# Patient Record
Sex: Female | Born: 1965 | Race: Black or African American | Hispanic: No | Marital: Married | Smoking: Never smoker
Health system: Southern US, Community
[De-identification: ages and names within clinical notes are randomized; demographics above are authoritative.]

## PROBLEM LIST (undated history)

## (undated) DIAGNOSIS — E785 Hyperlipidemia, unspecified: Secondary | ICD-10-CM

## (undated) DIAGNOSIS — F419 Anxiety disorder, unspecified: Secondary | ICD-10-CM

## (undated) DIAGNOSIS — F32A Depression, unspecified: Secondary | ICD-10-CM

## (undated) DIAGNOSIS — R011 Cardiac murmur, unspecified: Secondary | ICD-10-CM

## (undated) DIAGNOSIS — Z87898 Personal history of other specified conditions: Secondary | ICD-10-CM

## (undated) DIAGNOSIS — M1990 Unspecified osteoarthritis, unspecified site: Secondary | ICD-10-CM

## (undated) DIAGNOSIS — D573 Sickle-cell trait: Secondary | ICD-10-CM

## (undated) DIAGNOSIS — R0989 Other specified symptoms and signs involving the circulatory and respiratory systems: Secondary | ICD-10-CM

## (undated) DIAGNOSIS — K219 Gastro-esophageal reflux disease without esophagitis: Secondary | ICD-10-CM

## (undated) DIAGNOSIS — D649 Anemia, unspecified: Secondary | ICD-10-CM

## (undated) HISTORY — DX: Hyperlipidemia, unspecified: E78.5

## (undated) HISTORY — DX: Depression, unspecified: F32.A

## (undated) HISTORY — DX: Anxiety disorder, unspecified: F41.9

## (undated) HISTORY — PX: OTHER SURGICAL HISTORY: SHX169

## (undated) HISTORY — DX: Sickle-cell trait: D57.3

## (undated) HISTORY — DX: Other specified symptoms and signs involving the circulatory and respiratory systems: R09.89

## (undated) HISTORY — DX: Personal history of other specified conditions: Z87.898

## (undated) HISTORY — PX: DILATION AND CURETTAGE OF UTERUS: SHX78

## (undated) HISTORY — DX: Unspecified osteoarthritis, unspecified site: M19.90

---

## 1998-04-02 ENCOUNTER — Inpatient Hospital Stay (HOSPITAL_COMMUNITY): Admission: AD | Admit: 1998-04-02 | Discharge: 1998-04-05 | Payer: Self-pay | Admitting: Obstetrics and Gynecology

## 1998-04-07 ENCOUNTER — Encounter (HOSPITAL_COMMUNITY): Admission: RE | Admit: 1998-04-07 | Discharge: 1998-07-06 | Payer: Self-pay | Admitting: Obstetrics and Gynecology

## 1998-12-15 ENCOUNTER — Other Ambulatory Visit: Admission: RE | Admit: 1998-12-15 | Discharge: 1998-12-15 | Payer: Self-pay | Admitting: Obstetrics and Gynecology

## 1999-12-14 ENCOUNTER — Other Ambulatory Visit: Admission: RE | Admit: 1999-12-14 | Discharge: 1999-12-14 | Payer: Self-pay | Admitting: Obstetrics and Gynecology

## 2001-01-02 ENCOUNTER — Other Ambulatory Visit: Admission: RE | Admit: 2001-01-02 | Discharge: 2001-01-02 | Payer: Self-pay | Admitting: Obstetrics and Gynecology

## 2001-06-15 ENCOUNTER — Ambulatory Visit (HOSPITAL_COMMUNITY): Admission: RE | Admit: 2001-06-15 | Discharge: 2001-06-15 | Payer: Self-pay | Admitting: Obstetrics and Gynecology

## 2001-06-15 ENCOUNTER — Encounter: Payer: Self-pay | Admitting: Obstetrics and Gynecology

## 2001-12-18 ENCOUNTER — Other Ambulatory Visit: Admission: RE | Admit: 2001-12-18 | Discharge: 2001-12-18 | Payer: Self-pay | Admitting: Obstetrics and Gynecology

## 2002-12-24 ENCOUNTER — Other Ambulatory Visit: Admission: RE | Admit: 2002-12-24 | Discharge: 2002-12-24 | Payer: Self-pay | Admitting: Obstetrics and Gynecology

## 2003-01-16 ENCOUNTER — Encounter: Admission: RE | Admit: 2003-01-16 | Discharge: 2003-04-16 | Payer: Self-pay | Admitting: Obstetrics and Gynecology

## 2004-01-14 ENCOUNTER — Other Ambulatory Visit: Admission: RE | Admit: 2004-01-14 | Discharge: 2004-01-14 | Payer: Self-pay | Admitting: Obstetrics and Gynecology

## 2004-03-27 ENCOUNTER — Encounter: Admission: RE | Admit: 2004-03-27 | Discharge: 2004-03-27 | Payer: Self-pay | Admitting: Obstetrics and Gynecology

## 2004-09-22 ENCOUNTER — Emergency Department (HOSPITAL_COMMUNITY): Admission: EM | Admit: 2004-09-22 | Discharge: 2004-09-22 | Payer: Self-pay | Admitting: Family Medicine

## 2005-01-14 ENCOUNTER — Other Ambulatory Visit: Admission: RE | Admit: 2005-01-14 | Discharge: 2005-01-14 | Payer: Self-pay | Admitting: Obstetrics and Gynecology

## 2005-09-09 ENCOUNTER — Ambulatory Visit (HOSPITAL_COMMUNITY): Admission: RE | Admit: 2005-09-09 | Discharge: 2005-09-09 | Payer: Self-pay | Admitting: Obstetrics and Gynecology

## 2006-03-21 ENCOUNTER — Ambulatory Visit (HOSPITAL_COMMUNITY): Admission: RE | Admit: 2006-03-21 | Discharge: 2006-03-21 | Payer: Self-pay | Admitting: Obstetrics and Gynecology

## 2006-04-07 ENCOUNTER — Emergency Department (HOSPITAL_COMMUNITY): Admission: EM | Admit: 2006-04-07 | Discharge: 2006-04-07 | Payer: Self-pay | Admitting: Emergency Medicine

## 2007-04-06 HISTORY — PX: DILATION AND CURETTAGE OF UTERUS: SHX78

## 2007-06-12 ENCOUNTER — Emergency Department (HOSPITAL_COMMUNITY): Admission: EM | Admit: 2007-06-12 | Discharge: 2007-06-12 | Payer: Self-pay | Admitting: Emergency Medicine

## 2007-09-07 ENCOUNTER — Ambulatory Visit (HOSPITAL_COMMUNITY): Admission: RE | Admit: 2007-09-07 | Discharge: 2007-09-07 | Payer: Self-pay | Admitting: Obstetrics and Gynecology

## 2009-09-05 ENCOUNTER — Encounter: Admission: RE | Admit: 2009-09-05 | Discharge: 2009-09-05 | Payer: Self-pay | Admitting: Internal Medicine

## 2009-09-08 ENCOUNTER — Encounter: Admission: RE | Admit: 2009-09-08 | Discharge: 2009-09-08 | Payer: Self-pay | Admitting: Internal Medicine

## 2010-04-21 ENCOUNTER — Encounter
Admission: RE | Admit: 2010-04-21 | Discharge: 2010-04-21 | Payer: Self-pay | Source: Home / Self Care | Attending: Internal Medicine | Admitting: Internal Medicine

## 2010-04-26 ENCOUNTER — Encounter: Payer: Self-pay | Admitting: Obstetrics and Gynecology

## 2010-04-26 ENCOUNTER — Encounter: Payer: Self-pay | Admitting: Internal Medicine

## 2010-08-25 ENCOUNTER — Other Ambulatory Visit: Payer: Self-pay | Admitting: Internal Medicine

## 2010-08-25 DIAGNOSIS — N6310 Unspecified lump in the right breast, unspecified quadrant: Secondary | ICD-10-CM

## 2010-09-15 ENCOUNTER — Other Ambulatory Visit: Payer: Self-pay

## 2010-09-21 ENCOUNTER — Ambulatory Visit
Admission: RE | Admit: 2010-09-21 | Discharge: 2010-09-21 | Disposition: A | Discharge status: Home / Self Care | Payer: 59 | Source: Ambulatory Visit | Attending: Internal Medicine | Admitting: Internal Medicine

## 2010-09-21 DIAGNOSIS — N6310 Unspecified lump in the right breast, unspecified quadrant: Secondary | ICD-10-CM

## 2011-01-12 ENCOUNTER — Emergency Department (HOSPITAL_COMMUNITY): Payer: 59

## 2011-01-12 ENCOUNTER — Emergency Department (HOSPITAL_COMMUNITY)
Admission: EM | Admit: 2011-01-12 | Discharge: 2011-01-12 | Disposition: A | Discharge status: Home / Self Care | Payer: 59 | Attending: Emergency Medicine | Admitting: Emergency Medicine

## 2011-01-12 DIAGNOSIS — R079 Chest pain, unspecified: Secondary | ICD-10-CM | POA: Insufficient documentation

## 2011-01-12 DIAGNOSIS — R0602 Shortness of breath: Secondary | ICD-10-CM | POA: Insufficient documentation

## 2011-01-12 LAB — PROTIME-INR: INR: 0.96 (ref 0.00–1.49)

## 2011-01-12 LAB — DIFFERENTIAL
Eosinophils Relative: 2 % (ref 0–5)
Lymphocytes Relative: 35 % (ref 12–46)
Lymphs Abs: 2.4 10*3/uL (ref 0.7–4.0)
Monocytes Relative: 12 % (ref 3–12)
Neutrophils Relative %: 50 % (ref 43–77)

## 2011-01-12 LAB — BASIC METABOLIC PANEL
Calcium: 9.3 mg/dL (ref 8.4–10.5)
Chloride: 103 mEq/L (ref 96–112)
Creatinine, Ser: 0.56 mg/dL (ref 0.50–1.10)
GFR calc Af Amer: >90 mL/min (ref >90)
GFR calc non Af Amer: >90 mL/min (ref >90)
Glucose, Bld: 92 mg/dL (ref 70–99)

## 2011-01-12 LAB — POCT I-STAT TROPONIN I: Troponin i, poc: 0.01 ng/mL (ref 0.00–0.08)

## 2011-01-12 LAB — URINALYSIS, ROUTINE W REFLEX MICROSCOPIC
Nitrite: NEGATIVE
Specific Gravity, Urine: 1.006 (ref 1.005–1.030)

## 2011-01-12 LAB — CBC
HCT: 30.7 % — ABNORMAL LOW (ref 36.0–46.0)
Hemoglobin: 9.4 g/dL — ABNORMAL LOW (ref 12.0–15.0)
RBC: 5.14 MIL/uL — ABNORMAL HIGH (ref 3.87–5.11)
RDW: 20 % — ABNORMAL HIGH (ref 11.5–15.5)

## 2011-01-12 LAB — TROPONIN I: Troponin I: <0.3 ng/mL (ref <0.30)

## 2011-01-12 MED ORDER — IOHEXOL 300 MG/ML  SOLN
80.0000 mL | Freq: Once | INTRAMUSCULAR | Status: AC | PRN
  Administered 2011-01-12: 80 mL via INTRAVENOUS

## 2011-01-13 LAB — URINE CULTURE: Colony Count: 50000

## 2011-09-30 ENCOUNTER — Other Ambulatory Visit: Payer: Self-pay | Admitting: Internal Medicine

## 2011-09-30 DIAGNOSIS — Z1231 Encounter for screening mammogram for malignant neoplasm of breast: Secondary | ICD-10-CM

## 2011-10-04 ENCOUNTER — Ambulatory Visit
Admission: RE | Admit: 2011-10-04 | Discharge: 2011-10-04 | Disposition: A | Discharge status: Home / Self Care | Payer: 59 | Source: Ambulatory Visit | Attending: Internal Medicine | Admitting: Internal Medicine

## 2011-10-04 DIAGNOSIS — Z1231 Encounter for screening mammogram for malignant neoplasm of breast: Secondary | ICD-10-CM

## 2011-11-29 ENCOUNTER — Encounter (HOSPITAL_COMMUNITY): Payer: Self-pay | Admitting: Pharmacist

## 2011-12-01 ENCOUNTER — Other Ambulatory Visit: Payer: Self-pay | Admitting: Obstetrics & Gynecology

## 2011-12-03 ENCOUNTER — Encounter (HOSPITAL_COMMUNITY): Payer: Self-pay | Admitting: *Deleted

## 2011-12-09 MED ORDER — DEXTROSE 5 % IV SOLN
2.0000 g | Freq: Once | INTRAVENOUS | Status: AC
  Administered 2011-12-10: 2 g via INTRAVENOUS
  Filled 2011-12-09: qty 2

## 2011-12-10 ENCOUNTER — Encounter (HOSPITAL_COMMUNITY): Payer: Self-pay | Admitting: *Deleted

## 2011-12-10 ENCOUNTER — Ambulatory Visit (HOSPITAL_COMMUNITY)
Admission: RE | Admit: 2011-12-10 | Discharge: 2011-12-10 | Disposition: A | Discharge status: Home / Self Care | Payer: 59 | Source: Ambulatory Visit | Attending: Obstetrics & Gynecology | Admitting: Obstetrics & Gynecology

## 2011-12-10 ENCOUNTER — Encounter (HOSPITAL_COMMUNITY)
Admission: RE | Disposition: A | Discharge status: Home / Self Care | Payer: Self-pay | Source: Ambulatory Visit | Attending: Obstetrics & Gynecology

## 2011-12-10 ENCOUNTER — Encounter (HOSPITAL_COMMUNITY): Payer: Self-pay | Admitting: Anesthesiology

## 2011-12-10 ENCOUNTER — Ambulatory Visit (HOSPITAL_COMMUNITY): Payer: 59 | Admitting: Anesthesiology

## 2011-12-10 DIAGNOSIS — N920 Excessive and frequent menstruation with regular cycle: Secondary | ICD-10-CM | POA: Insufficient documentation

## 2011-12-10 DIAGNOSIS — D251 Intramural leiomyoma of uterus: Secondary | ICD-10-CM | POA: Insufficient documentation

## 2011-12-10 DIAGNOSIS — D250 Submucous leiomyoma of uterus: Secondary | ICD-10-CM | POA: Insufficient documentation

## 2011-12-10 DIAGNOSIS — D649 Anemia, unspecified: Secondary | ICD-10-CM | POA: Insufficient documentation

## 2011-12-10 HISTORY — DX: Gastro-esophageal reflux disease without esophagitis: K21.9

## 2011-12-10 HISTORY — DX: Anemia, unspecified: D64.9

## 2011-12-10 HISTORY — DX: Cardiac murmur, unspecified: R01.1

## 2011-12-10 LAB — CBC
Hemoglobin: 9.1 g/dL — ABNORMAL LOW (ref 12.0–15.0)
MCH: 19.7 pg — ABNORMAL LOW (ref 26.0–34.0)
MCV: 61.8 fL — ABNORMAL LOW (ref 78.0–100.0)
Platelets: 360 10*3/uL (ref 150–400)
RBC: 4.63 MIL/uL (ref 3.87–5.11)
WBC: 4.6 10*3/uL (ref 4.0–10.5)

## 2011-12-10 LAB — PREGNANCY, URINE: Preg Test, Ur: NEGATIVE

## 2011-12-10 SURGERY — DILATATION & CURETTAGE/HYSTEROSCOPY WITH RESECTOCOPE
Anesthesia: General | Site: Uterus | Wound class: Clean Contaminated

## 2011-12-10 MED ORDER — SODIUM CHLORIDE BACTERIOSTATIC 0.9 % IJ SOLN
INTRAMUSCULAR | Status: DC | PRN
  Administered 2011-12-10: 3000 mL

## 2011-12-10 MED ORDER — FENTANYL CITRATE 0.05 MG/ML IJ SOLN
INTRAMUSCULAR | Status: AC
  Filled 2011-12-10: qty 5

## 2011-12-10 MED ORDER — KETOROLAC TROMETHAMINE 30 MG/ML IJ SOLN
INTRAMUSCULAR | Status: DC | PRN
  Administered 2011-12-10: 30 mg via INTRAVENOUS

## 2011-12-10 MED ORDER — CHLOROPROCAINE HCL 1 % IJ SOLN
INTRAMUSCULAR | Status: AC
  Filled 2011-12-10: qty 30

## 2011-12-10 MED ORDER — FENTANYL CITRATE 0.05 MG/ML IJ SOLN: 25.0000 ug | INTRAMUSCULAR | Status: DC | PRN

## 2011-12-10 MED ORDER — OXYCODONE-ACETAMINOPHEN 5-325 MG PO TABS
ORAL_TABLET | ORAL | Status: AC
  Administered 2011-12-10: 1 via ORAL
  Filled 2011-12-10: qty 1

## 2011-12-10 MED ORDER — ONDANSETRON HCL 4 MG/2ML IJ SOLN
INTRAMUSCULAR | Status: DC | PRN
  Administered 2011-12-10: 4 mg via INTRAVENOUS

## 2011-12-10 MED ORDER — OXYCODONE-ACETAMINOPHEN 5-325 MG PO TABS
1.0000 | ORAL_TABLET | ORAL | Status: DC | PRN
  Administered 2011-12-10: 1 via ORAL

## 2011-12-10 MED ORDER — CHLOROPROCAINE HCL 1 % IJ SOLN
INTRAMUSCULAR | Status: DC | PRN
  Administered 2011-12-10: 20 mL

## 2011-12-10 MED ORDER — PHENYLEPHRINE 40 MCG/ML (10ML) SYRINGE FOR IV PUSH (FOR BLOOD PRESSURE SUPPORT)
PREFILLED_SYRINGE | INTRAVENOUS | Status: AC
  Filled 2011-12-10: qty 5

## 2011-12-10 MED ORDER — ONDANSETRON HCL 4 MG/2ML IJ SOLN
INTRAMUSCULAR | Status: AC
  Filled 2011-12-10: qty 2

## 2011-12-10 MED ORDER — OXYCODONE-ACETAMINOPHEN 7.5-325 MG PO TABS: 1.0000 | ORAL_TABLET | ORAL | Status: AC | PRN

## 2011-12-10 MED ORDER — LIDOCAINE HCL (CARDIAC) 20 MG/ML IV SOLN
INTRAVENOUS | Status: AC
  Filled 2011-12-10: qty 5

## 2011-12-10 MED ORDER — KETOROLAC TROMETHAMINE 60 MG/2ML IM SOLN
INTRAMUSCULAR | Status: AC
  Filled 2011-12-10: qty 2

## 2011-12-10 MED ORDER — KETOROLAC TROMETHAMINE 30 MG/ML IJ SOLN: 15.0000 mg | Freq: Once | INTRAMUSCULAR | Status: DC | PRN

## 2011-12-10 MED ORDER — PROPOFOL 10 MG/ML IV EMUL
INTRAVENOUS | Status: DC | PRN
  Administered 2011-12-10: 200 mg via INTRAVENOUS

## 2011-12-10 MED ORDER — MEPERIDINE HCL 25 MG/ML IJ SOLN: 6.2500 mg | INTRAMUSCULAR | Status: DC | PRN

## 2011-12-10 MED ORDER — PROPOFOL 10 MG/ML IV EMUL
INTRAVENOUS | Status: AC
  Filled 2011-12-10: qty 20

## 2011-12-10 MED ORDER — MIDAZOLAM HCL 5 MG/5ML IJ SOLN
INTRAMUSCULAR | Status: DC | PRN
  Administered 2011-12-10: 2 mg via INTRAVENOUS

## 2011-12-10 MED ORDER — LACTATED RINGERS IV SOLN
INTRAVENOUS | Status: DC
  Administered 2011-12-10 (×3): via INTRAVENOUS

## 2011-12-10 MED ORDER — LIDOCAINE HCL (CARDIAC) 20 MG/ML IV SOLN
INTRAVENOUS | Status: DC | PRN
  Administered 2011-12-10: 60 mg via INTRAVENOUS

## 2011-12-10 MED ORDER — ONDANSETRON HCL 4 MG/2ML IJ SOLN: 4.0000 mg | Freq: Once | INTRAMUSCULAR | Status: DC | PRN

## 2011-12-10 MED ORDER — MIDAZOLAM HCL 2 MG/2ML IJ SOLN
INTRAMUSCULAR | Status: AC
  Filled 2011-12-10: qty 2

## 2011-12-10 MED ORDER — CARBOPROST TROMETHAMINE 250 MCG/ML IM SOLN
INTRAMUSCULAR | Status: AC
  Filled 2011-12-10: qty 1

## 2011-12-10 MED ORDER — METHYLERGONOVINE MALEATE 0.2 MG/ML IJ SOLN
INTRAMUSCULAR | Status: AC
  Filled 2011-12-10: qty 1

## 2011-12-10 MED ORDER — FENTANYL CITRATE 0.05 MG/ML IJ SOLN
INTRAMUSCULAR | Status: DC | PRN
  Administered 2011-12-10: 50 ug via INTRAVENOUS
  Administered 2011-12-10: 100 ug via INTRAVENOUS
  Administered 2011-12-10: 50 ug via INTRAVENOUS

## 2011-12-10 MED ORDER — DEXAMETHASONE SODIUM PHOSPHATE 10 MG/ML IJ SOLN
INTRAMUSCULAR | Status: AC
  Filled 2011-12-10: qty 1

## 2011-12-10 MED ORDER — DEXAMETHASONE SODIUM PHOSPHATE 4 MG/ML IJ SOLN
INTRAMUSCULAR | Status: DC | PRN
  Administered 2011-12-10: 10 mg via INTRAVENOUS

## 2011-12-10 SURGICAL SUPPLY — 16 items
ABLATOR ENDOMETRIAL BIPOLAR (ABLATOR) IMPLANT
CANISTER SUCTION 2500CC (MISCELLANEOUS) ×2 IMPLANT
CATH ROBINSON RED A/P 16FR (CATHETERS) ×2 IMPLANT
CLOTH BEACON ORANGE TIMEOUT ST (SAFETY) ×2 IMPLANT
CONTAINER PREFILL 10% NBF 60ML (FORM) ×4 IMPLANT
ELECT REM PT RETURN 9FT ADLT (ELECTROSURGICAL)
ELECTRODE REM PT RTRN 9FT ADLT (ELECTROSURGICAL) ×1 IMPLANT
GLOVE BIO SURGEON STRL SZ 6.5 (GLOVE) ×4 IMPLANT
GLOVE BIOGEL PI IND STRL 7.0 (GLOVE) ×1 IMPLANT
GLOVE BIOGEL PI INDICATOR 7.0 (GLOVE) ×1
GOWN PREVENTION PLUS LG XLONG (DISPOSABLE) ×4 IMPLANT
GOWN STRL REIN XL XLG (GOWN DISPOSABLE) ×2 IMPLANT
LOOP ANGLED CUTTING 22FR (CUTTING LOOP) IMPLANT
PACK HYSTEROSCOPY LF (CUSTOM PROCEDURE TRAY) ×2 IMPLANT
TOWEL OR 17X24 6PK STRL BLUE (TOWEL DISPOSABLE) ×4 IMPLANT
WATER STERILE IRR 1000ML POUR (IV SOLUTION) ×2 IMPLANT

## 2011-12-10 NOTE — Discharge Summary (Signed)
  Physician Discharge Summary  Patient ID: Wendy Pineda MRN: [id number] DOB/AGE: 1965/09/22 46 y.o.  Admit date: 12/10/2011 Discharge date: 12/10/2011  Admission Diagnoses: Uterine Fibroids  Discharge Diagnoses: Uterine Fibroids        Active Problems:  * No active hospital problems. *    Discharged Condition: good  Hospital Course: Outpatient Consults: None  Treatments: surgery: Hysteroscopy, ressection of submucosal myoma, D+C  Disposition: 01-Home or Self Care   Medication List  As of 12/10/2011  3:58 PM   TAKE these medications         ANTIOXIDANT PO   Take 1 packet by mouth daily.      B COMPLEX PO   Take 1 packet by mouth daily.      folic acid 400 MCG tablet   Commonly known as: FOLVITE   Take 400 mcg by mouth daily.      IRON PO   Take 1 tablet by mouth daily.      oxyCODONE-acetaminophen 7.5-325 MG per tablet   Commonly known as: PERCOCET   Take 1 tablet by mouth every 4 (four) hours as needed for pain.           Follow-up Information    Follow up with Soleia Badolato,MARIE-LYNE, MD in 3 weeks.   Contact information:   [address] [place] [address] [phone]          SignedGenia Del, MD 12/10/2011, 3:58 PM

## 2011-12-10 NOTE — Anesthesia Preprocedure Evaluation (Signed)
Anesthesia Evaluation  Patient identified by MRN, date of birth, ID band Patient awake    Reviewed: Allergy & Precautions, H&P , NPO status , Patient's Chart, lab work & pertinent test results  Airway Mallampati: I TM Distance: >3 FB Neck ROM: full    Dental No notable dental hx. (+) Teeth Intact   Pulmonary neg pulmonary ROS,    Pulmonary exam normal       Cardiovascular     Neuro/Psych negative neurological ROS  negative psych ROS   GI/Hepatic Neg liver ROS,   Endo/Other  negative endocrine ROS  Renal/GU negative Renal ROS  negative genitourinary   Musculoskeletal negative musculoskeletal ROS (+)   Abdominal Normal abdominal exam  (+)   Peds negative pediatric ROS (+)  Hematology negative hematology ROS (+)   Anesthesia Other Findings   Reproductive/Obstetrics negative OB ROS                           Anesthesia Physical Anesthesia Plan  ASA: II  Anesthesia Plan: General   Post-op Pain Management:    Induction: Intravenous  Airway Management Planned: LMA  Additional Equipment:   Intra-op Plan:   Post-operative Plan:   Informed Consent: I have reviewed the patients History and Physical, chart, labs and discussed the procedure including the risks, benefits and alternatives for the proposed anesthesia with the patient or authorized representative who has indicated his/her understanding and acceptance.     Plan Discussed with: CRNA and Surgeon  Anesthesia Plan Comments:         Anesthesia Quick Evaluation

## 2011-12-10 NOTE — Anesthesia Postprocedure Evaluation (Signed)
Anesthesia Post Note  Patient: Wendy Pineda  Procedure(s) Performed: Procedure(s) (LRB): DILATATION & CURETTAGE/HYSTEROSCOPY WITH RESECTOCOPE (N/A)  Anesthesia type: GA  Patient location: PACU  Post pain: Pain level controlled  Post assessment: Post-op Vital signs reviewed  Last Vitals:  Filed Vitals:   12/10/11 1615  BP:   Pulse: 57  Temp:   Resp: 16    Post vital signs: Reviewed  Level of consciousness: sedated  Complications: No apparent anesthesia complications

## 2011-12-10 NOTE — H&P (Signed)
Wendy Pineda is an 46 y.o. female  G1P1  RP:  Menorrhagia with multiple uterine myomas, 2 SM myomas for HSC, D+C, Ressection of IU lesions with TruClear device.  Pertinent Gynecological History: Menses: flow is excessive with use of many pads or tampons on heaviest days Contraception: Infertility Blood transfusions: none Sexually transmitted diseases: no past history Previous GYN Procedures: DNC  Last mammogram: Normal  Last pap: normal  OB History:  G1P1   Menstrual History: Patient's last menstrual period was 11/26/2011.    Past Medical History  Diagnosis Date  . Heart murmur   . Anemia   . GERD (gastroesophageal reflux disease)     occasional-tums prn-overindulgence    Past Surgical History  Procedure Date  . Dilation and curettage of uterus 2009    [facility]  . Colonoscopy     History reviewed. No pertinent family history.  Social History:  reports that she has never smoked. She does not have any smokeless tobacco history on file. She reports that she does not drink alcohol or use illicit drugs.  Allergies:  Allergies  Allergen Reactions  . Sulfa Antibiotics Hives and Swelling    Prescriptions prior to admission  Medication Sig Dispense Refill  . B Complex Vitamins (B COMPLEX PO) Take 1 packet by mouth daily.      . folic acid (FOLVITE) 400 MCG tablet Take 400 mcg by mouth daily.      . IRON PO Take 1 tablet by mouth daily.      . Multiple Vitamins-Minerals (ANTIOXIDANT PO) Take 1 packet by mouth daily.        Blood pressure 153/81, pulse 71, temperature 98.4 F (36.9 C), temperature source Oral, resp. rate 18, height 5\' 7"  (1.702 m), weight 76.658 kg (169 lb), last menstrual period 11/26/2011, SpO2 99.00%.  Pelvic US:  Multiple uterine myomas with probably 2 SM myomas 1.8 and 2 cm in diameter.  Results for orders placed during the hospital encounter of 12/10/11 (from the past 24 hour(s))  CBC     Status: Abnormal   Collection Time   12/10/11 12:31 PM   Component Value Range   WBC 4.6  4.0 - 10.5 K/uL   RBC 4.63  3.87 - 5.11 MIL/uL   Hemoglobin 9.1 (*) 12.0 - 15.0 g/dL   HCT 11.9 (*) 14.7 - 82.9 %   MCV 61.8 (*) 78.0 - 100.0 fL   MCH 19.7 (*) 26.0 - 34.0 pg   MCHC 31.8  30.0 - 36.0 g/dL   RDW 56.2 (*) 13.0 - 86.5 %   Platelets 360  150 - 400 K/uL  PREGNANCY, URINE     Status: Normal   Collection Time   12/10/11 12:32 PM      Component Value Range   Preg Test, Ur NEGATIVE  NEGATIVE    No results found.  Assessment/Plan: Menorrhagia with multiple uterine myomas with 2 probable SM myomas 2 and 1.8 cm for HSC, D+C, Ressection of myomas with TruClear.  Surgery and risks reviewed.  Savina Olshefski,MARIE-LYNE 12/10/2011, 12:59 PM

## 2011-12-10 NOTE — Anesthesia Procedure Notes (Signed)
Procedure Name: LMA Insertion Date/Time: 12/10/2011 2:13 PM Performed by: Graciela Husbands Pre-anesthesia Checklist: Suction available, Emergency Drugs available, Timeout performed, Patient identified and Patient being monitored Patient Re-evaluated:Patient Re-evaluated prior to inductionOxygen Delivery Method: Circle system utilized Preoxygenation: Pre-oxygenation with 100% oxygen Intubation Type: IV induction Ventilation: Mask ventilation without difficulty LMA: LMA inserted LMA Size: 4.0 Number of attempts: 1 Placement Confirmation: positive ETCO2 and breath sounds checked- equal and bilateral Tube secured with: Tape Dental Injury: Teeth and Oropharynx as per pre-operative assessment

## 2011-12-10 NOTE — Op Note (Signed)
12/10/2011  3:43 PM  PATIENT:  Wendy Pineda  46 y.o. female  PRE-OPERATIVE DIAGNOSIS:  Menorrhagia, anemia, submucosal myomas  POST-OPERATIVE DIAGNOSIS:  same  PROCEDURE:  Procedure(s): DILATATION & CURETTAGE/HYSTEROSCOPY WITH RESECTOCOPE  SURGEON:  Surgeon(s): Genia Del, MD  ASSISTANTS: none   ANESTHESIA:   general  PROCEDURE:  Under general anesthesia with laryngeal mask the patient is in lithotomy position. She is prepped with Betadine on the suprapubic vulvar and vaginal areas. And draped as usual. The vaginal exam reveals an enlarged nodular uterus about 10 cm, mobile, no adnexal mass.  The speculum was introduced in the vagina. The anterior lip of the cervix is grasped with a tenaculum. A paracervical block is done with Nesacaine 1% a total of 20 cc at 4 and 8:00.  Dilation of the cervix with Hegar dilators up to #33 without difficulty.  The operative hysteroscope with the Myoma Truclear device are introduced in the intrauterine cavity.  A large, about 2.5-3 cm in diameter, submucosal myoma is seen attached to the left lateral wall of the intrauterine cavity.  2 intramural myoma is with a small sub-mucosal component are present on the right anterior wall and a right posterior wall of the uterus. Both ostia are well visualized. Pictures are taken. We then used the true clear device to resect the left submucosal myoma completely. We then resect the submucosal part of the intramural myomas on the right anterior and posterior walls.  A systematic endometrial curettage is done with a sharp curet over all endometrial surfaces. The 2 specimens are sent separately to pathology.  The intrauterine cavity is visualized once more with the hysteroscope. Minor resection and aspiration of any free pieces of myoma is removed from the cavity. Hemostasis is adequate at all levels. All instruments are removed. Hemostasis was adequate on the cervix.  The patient was brought to recovery room in good and  stable status.    ESTIMATED BLOOD LOSS:  25 cc   Intake/Output Summary (Last 24 hours) at 12/10/11 1543 Last data filed at 12/10/11 1536  Gross per 24 hour  Intake   1800 ml  Output     25 ml  Net   1775 ml     BLOOD ADMINISTERED:none   LOCAL MEDICATIONS USED:  OTHER Nesacaine  SPECIMEN:  Source of Specimen:  Submucosal myoma and Endometrial curettings  DISPOSITION OF SPECIMEN:  PATHOLOGY  COUNTS:  YES  PLAN OF CARE: Transfer to PACU    Genia Del MD  12/10/2011  At 3:45 pm

## 2011-12-10 NOTE — Transfer of Care (Signed)
Immediate Anesthesia Transfer of Care Note  Patient: Wendy Pineda  Procedure(s) Performed: Procedure(s) (LRB) with comments: DILATATION & CURETTAGE/HYSTEROSCOPY WITH RESECTOCOPE (N/A) - resection of submucosal myoma  Patient Location: PACU  Anesthesia Type: General  Level of Consciousness: awake, alert  and oriented  Airway & Oxygen Therapy: Patient Spontanous Breathing and Patient connected to nasal cannula oxygen  Post-op Assessment: Report given to PACU RN and Post -op Vital signs reviewed and stable  Post vital signs: Reviewed and stable  Complications: No apparent anesthesia complications

## 2012-06-02 ENCOUNTER — Encounter: Payer: Self-pay | Admitting: Cardiology

## 2013-03-30 ENCOUNTER — Ambulatory Visit (INDEPENDENT_AMBULATORY_CARE_PROVIDER_SITE_OTHER): Payer: 59 | Admitting: Internal Medicine

## 2013-03-30 VITALS — BP 118/76 | HR 90 | Temp 98.0°F | Resp 16 | Ht 67.0 in | Wt 155.2 lb

## 2013-03-30 DIAGNOSIS — R350 Frequency of micturition: Secondary | ICD-10-CM

## 2013-03-30 DIAGNOSIS — R05 Cough: Secondary | ICD-10-CM

## 2013-03-30 LAB — POCT UA - MICROSCOPIC ONLY
Bacteria, U Microscopic: NEGATIVE
Casts, Ur, LPF, POC: NEGATIVE
Crystals, Ur, HPF, POC: NEGATIVE

## 2013-03-30 LAB — POCT URINALYSIS DIPSTICK
Ketones, UA: NEGATIVE
Protein, UA: NEGATIVE
Spec Grav, UA: 1.01
Urobilinogen, UA: 0.2
pH, UA: 7

## 2013-03-30 MED ORDER — AZITHROMYCIN 250 MG PO TABS: ORAL_TABLET | ORAL | Status: DC

## 2013-03-30 MED ORDER — HYDROCODONE-HOMATROPINE 5-1.5 MG/5ML PO SYRP: 5.0000 mL | ORAL_SOLUTION | Freq: Four times a day (QID) | ORAL | Status: DC | PRN

## 2013-03-30 NOTE — Progress Notes (Signed)
   Subjective:    Patient ID: Wendy Pineda, female    DOB: [birth year], 47 y.o.   MRN: [id number]  HPI one week ago started with sore throat and fever Has progressed to involve both ears with pain, cough at night, productive cough over the last 24 hours and fatigue Has also noticed urinary frequency over the last 4 days with pressure sensation but no dysuria Married without outside partners  There are no active problems to display for this patient.  no outside medications   Review of Systems Noncontributory     Objective:   Physical Exam BP 118/76  Pulse 90  Temp(Src) 98 F (36.7 C) (Oral)  Resp 16  Ht 5\' 7"  (1.702 m)  Wt 155 lb 3.2 oz (70.398 kg)  BMI 24.30 kg/m2  SpO2 100%  LMP 02/27/2013 TMs with fluid but no redness Conjunctiva clear Nares boggy Throat clear No nodes Chest with slight wheezing on expiration and fine rales at the right base Heart regular Abdomen benign  Results for orders placed in visit on 03/30/13  POCT UA - MICROSCOPIC ONLY      Result Value Range   WBC, Ur, HPF, POC neg     RBC, urine, microscopic neg     Bacteria, U Microscopic neg     Mucus, UA neg     Epithelial cells, urine per micros 0-2     Crystals, Ur, HPF, POC neg     Casts, Ur, LPF, POC neg     Yeast, UA neg    POCT URINALYSIS DIPSTICK      Result Value Range   Color, UA yellow     Clarity, UA clear     Glucose, UA neg     Bilirubin, UA neg     Ketones, UA neg     Spec Grav, UA 1.010     Blood, UA neg     pH, UA 7.0     Protein, UA neg     Urobilinogen, UA 0.2     Nitrite, UA neg     Leukocytes, UA Negative           Assessment & Plan:  Urinary frequency - Plan: POCT UA - Microscopic Only, POCT urinalysis dipstick  Cough due lower resp infection? RLL community-acquired pneumonia  Meds ordered this encounter  Medications  . HYDROcodone-homatropine (HYCODAN) 5-1.5 MG/5ML syrup    Sig: Take 5 mLs by mouth every 6 (six) hours as needed for cough.    Dispense:  120  mL    Refill:  0  . azithromycin (ZITHROMAX) 250 MG tablet    Sig: As packaged    Dispense:  6 tablet    Refill:  0   Followup in 3 days force

## 2013-06-21 ENCOUNTER — Ambulatory Visit (INDEPENDENT_AMBULATORY_CARE_PROVIDER_SITE_OTHER): Payer: 59 | Admitting: Emergency Medicine

## 2013-06-21 VITALS — BP 120/80 | HR 82 | Temp 98.1°F | Resp 16 | Ht 65.5 in | Wt 151.4 lb

## 2013-06-21 DIAGNOSIS — S060X0A Concussion without loss of consciousness, initial encounter: Secondary | ICD-10-CM

## 2013-06-21 NOTE — Progress Notes (Signed)
[facility] [address], [place] [address] [phone]  Date:  06/21/2013   Name:  Wendy Pineda   DOB:  1966/02/03   MRN:  [id number]  PCP:  No primary provider on file.    Chief Complaint: Headache and Memory Loss   History of Present Illness:  Wendy Pineda is a 48 y.o. very pleasant female patient who presents with the following:  Patient suffered a head injury when she walked into a hoist block two weeks ago.  She was neither knocked down to the ground nor knocked out.  She has been seen four times by her workers comp doc since the injury, as recently as yesterday, and undergone a reportedly negative CT and has been referred to a neurologist. She was given a prescription for norco last night that she has not filled and is complaining bitterly that no one is helping her and nothing has been done for her. She was sent home from work today as they cannot offer her a position that meets her work limitations.  She has requested the first available appt with the neurology consultant to see if they cannot move her up from the 30th of this month.   She has no new symptoms related to this apparent concussion with no LOC. She has no focal neuro or visual symptoms.   No improvement with over the counter medications or other home remedies. Denies other complaint or health concern today.   There are no active problems to display for this patient.   Past Medical History  Diagnosis Date  . Heart murmur     echo 10/11/2007 EF >55%mild, MR  . Anemia   . GERD (gastroesophageal reflux disease)     occasional-tums prn-overindulgence  . Hx of chest pain     neg. lexiscan myoview 02/23/2011  . Hyperlipidemia     treated by diet  . Carotid bruit     carotid dopplers 12/22/2010, no hemodynamically significant stenosis    Past Surgical History  Procedure Laterality Date  . Dilation and curettage of uterus  2009    [facility]  . Colonoscopy      History  Substance Use Topics  .  Smoking status: Never Smoker   . Smokeless tobacco: Never Used  . Alcohol Use: No    Family History  Problem Relation Age of Onset  . Stroke Maternal Grandmother   . Cancer Maternal Grandfather     Allergies  Allergen Reactions  . Sulfa Antibiotics Hives and Swelling    Medication list has been reviewed and updated.  Current Outpatient Prescriptions on File Prior to Visit  Medication Sig Dispense Refill  . folic acid (FOLVITE) 865 MCG tablet Take 400 mcg by mouth daily.      . IRON PO Take 1 tablet by mouth daily.      . Multiple Vitamins-Minerals (ANTIOXIDANT PO) Take 1 packet by mouth daily.      Marland Kitchen azithromycin (ZITHROMAX) 250 MG tablet As packaged  6 tablet  0  . B Complex Vitamins (B COMPLEX PO) Take 1 packet by mouth daily.      Marland Kitchen HYDROcodone-homatropine (HYCODAN) 5-1.5 MG/5ML syrup Take 5 mLs by mouth every 6 (six) hours as needed for cough.  120 mL  0   No current facility-administered medications on file prior to visit.    Review of Systems:  As per HPI, otherwise negative.    Physical Examination: Filed Vitals:   06/21/13 1808  BP: 120/80  Pulse: 82  Temp: 98.1 F (36.7 C)  Resp: 16   Filed Vitals:   06/21/13 1808  Height: 5' 5.5" (1.664 m)  Weight: 151 lb 6.4 oz (68.675 kg)   Body mass index is 24.8 kg/(m^2). Ideal Body Weight: Weight in (lb) to have BMI = 25: 152.2    GEN: WDWN, NAD, Non-toxic, Alert & Oriented x 3. Mae.   HEENT: Atraumatic, Normocephalic.  Ears and Nose: No external deformity. EXTR: No clubbing/cyanosis/edema NEURO: Normal gait, balance and coordination. PSYCH: Normally interactive. Conversant. Not depressed or anxious appearing.  Calm demeanor.    Assessment and Plan: Concussion with no LOC Follow up with neuro I explained repeatedly the risk of her having to pay out of pocket for any care she seeks outside of her [facility] physician  Signed,  Ellison Carwin, MD

## 2013-06-21 NOTE — Patient Instructions (Signed)

## 2013-07-30 ENCOUNTER — Other Ambulatory Visit: Payer: Self-pay

## 2013-11-05 ENCOUNTER — Other Ambulatory Visit: Payer: Self-pay

## 2013-11-05 DIAGNOSIS — Z1231 Encounter for screening mammogram for malignant neoplasm of breast: Secondary | ICD-10-CM

## 2013-11-15 ENCOUNTER — Ambulatory Visit: Payer: 59

## 2014-12-18 ENCOUNTER — Other Ambulatory Visit: Payer: Self-pay | Admitting: Obstetrics & Gynecology

## 2014-12-27 ENCOUNTER — Inpatient Hospital Stay (HOSPITAL_COMMUNITY): Admission: RE | Admit: 2014-12-27 | Payer: Self-pay | Source: Ambulatory Visit

## 2015-01-03 ENCOUNTER — Encounter (HOSPITAL_COMMUNITY): Payer: Self-pay

## 2015-01-03 ENCOUNTER — Encounter (HOSPITAL_COMMUNITY)
Admission: RE | Admit: 2015-01-03 | Discharge: 2015-01-03 | Disposition: A | Discharge status: Home / Self Care | Payer: 59 | Source: Ambulatory Visit | Attending: Obstetrics & Gynecology | Admitting: Obstetrics & Gynecology

## 2015-01-03 DIAGNOSIS — D250 Submucous leiomyoma of uterus: Secondary | ICD-10-CM | POA: Diagnosis not present

## 2015-01-03 DIAGNOSIS — Z79899 Other long term (current) drug therapy: Secondary | ICD-10-CM | POA: Diagnosis not present

## 2015-01-03 DIAGNOSIS — Z882 Allergy status to sulfonamides status: Secondary | ICD-10-CM | POA: Diagnosis not present

## 2015-01-03 DIAGNOSIS — K219 Gastro-esophageal reflux disease without esophagitis: Secondary | ICD-10-CM | POA: Diagnosis not present

## 2015-01-03 DIAGNOSIS — E785 Hyperlipidemia, unspecified: Secondary | ICD-10-CM | POA: Diagnosis not present

## 2015-01-03 DIAGNOSIS — R011 Cardiac murmur, unspecified: Secondary | ICD-10-CM | POA: Diagnosis not present

## 2015-01-03 LAB — CBC
HEMATOCRIT: 32.2 % — AB (ref 36.0–46.0)
Hemoglobin: 10.3 g/dL — ABNORMAL LOW (ref 12.0–15.0)
MCH: 20.4 pg — AB (ref 26.0–34.0)
MCHC: 32 g/dL (ref 30.0–36.0)
MCV: 63.6 fL — AB (ref 78.0–100.0)
Platelets: 343 10*3/uL (ref 150–400)
RBC: 5.06 MIL/uL (ref 3.87–5.11)
RDW: 23.1 % — AB (ref 11.5–15.5)
WBC: 4.6 10*3/uL (ref 4.0–10.5)

## 2015-01-03 NOTE — Patient Instructions (Addendum)
Your procedure is scheduled on: January 06, 2015  Enter through the Main Entrance of [facility] at:  7:30 am   Pick up the phone at the desk and dial [phone].  Call this number if you have problems the morning of surgery: [phone].  Remember: Do NOT eat food: after midnight on Sunday night  Do NOT drink clear liquids after:  Midnight on Sunday night  Take these medicines the morning of surgery with a SIP OF WATER: none   Do NOT wear jewelry (body piercing), metal hair clips/bobby pins, make-up, or nail polish. Do NOT wear lotions, powders, or perfumes.  You may wear deoderant. Do NOT shave for 48 hours prior to surgery. Do NOT bring valuables to the hospital. Contacts, dentures, or bridgework may not be worn into surgery. Have a responsible adult drive you home and stay with you for 24 hours after your procedure

## 2015-01-06 ENCOUNTER — Ambulatory Visit (HOSPITAL_COMMUNITY)
Admission: AD | Admit: 2015-01-06 | Discharge: 2015-01-06 | Disposition: A | Discharge status: Home / Self Care | Payer: 59 | Source: Ambulatory Visit | Attending: Obstetrics & Gynecology | Admitting: Obstetrics & Gynecology

## 2015-01-06 ENCOUNTER — Ambulatory Visit (HOSPITAL_COMMUNITY): Payer: 59 | Admitting: Anesthesiology

## 2015-01-06 ENCOUNTER — Encounter (HOSPITAL_COMMUNITY): Payer: Self-pay

## 2015-01-06 ENCOUNTER — Encounter (HOSPITAL_COMMUNITY)
Admission: AD | Disposition: A | Discharge status: Home / Self Care | Payer: Self-pay | Source: Ambulatory Visit | Attending: Obstetrics & Gynecology

## 2015-01-06 DIAGNOSIS — R011 Cardiac murmur, unspecified: Secondary | ICD-10-CM | POA: Insufficient documentation

## 2015-01-06 DIAGNOSIS — Z79899 Other long term (current) drug therapy: Secondary | ICD-10-CM | POA: Insufficient documentation

## 2015-01-06 DIAGNOSIS — E785 Hyperlipidemia, unspecified: Secondary | ICD-10-CM | POA: Insufficient documentation

## 2015-01-06 DIAGNOSIS — D250 Submucous leiomyoma of uterus: Secondary | ICD-10-CM | POA: Insufficient documentation

## 2015-01-06 DIAGNOSIS — Z882 Allergy status to sulfonamides status: Secondary | ICD-10-CM | POA: Insufficient documentation

## 2015-01-06 DIAGNOSIS — K219 Gastro-esophageal reflux disease without esophagitis: Secondary | ICD-10-CM | POA: Insufficient documentation

## 2015-01-06 HISTORY — PX: DILITATION & CURRETTAGE/HYSTROSCOPY WITH VERSAPOINT RESECTION: SHX5571

## 2015-01-06 LAB — PREGNANCY, URINE: PREG TEST UR: NEGATIVE

## 2015-01-06 SURGERY — DILATATION & CURETTAGE/HYSTEROSCOPY WITH VERSAPOINT RESECTION
Anesthesia: General

## 2015-01-06 MED ORDER — LIDOCAINE HCL (CARDIAC) 20 MG/ML IV SOLN
INTRAVENOUS | Status: DC | PRN
  Administered 2015-01-06 (×2): 50 mg via INTRAVENOUS

## 2015-01-06 MED ORDER — SODIUM CHLORIDE 0.9 % IR SOLN
Status: DC | PRN
  Administered 2015-01-06 (×3): 3000 mL

## 2015-01-06 MED ORDER — ONDANSETRON HCL 4 MG/2ML IJ SOLN
INTRAMUSCULAR | Status: DC | PRN
  Administered 2015-01-06: 4 mg via INTRAVENOUS

## 2015-01-06 MED ORDER — CHLOROPROCAINE HCL 1 % IJ SOLN
INTRAMUSCULAR | Status: DC | PRN
Start: 2015-01-06 — End: 2015-01-06
  Administered 2015-01-06: 20 mL

## 2015-01-06 MED ORDER — CHLOROPROCAINE HCL 1 % IJ SOLN
INTRAMUSCULAR | Status: AC
  Filled 2015-01-06: qty 30

## 2015-01-06 MED ORDER — CEFAZOLIN SODIUM-DEXTROSE 2-3 GM-% IV SOLR
INTRAVENOUS | Status: AC
  Filled 2015-01-06: qty 50

## 2015-01-06 MED ORDER — PROPOFOL 10 MG/ML IV BOLUS
INTRAVENOUS | Status: AC
Start: 2015-01-06 — End: 2015-01-06
  Filled 2015-01-06: qty 20

## 2015-01-06 MED ORDER — ONDANSETRON HCL 4 MG/2ML IJ SOLN
INTRAMUSCULAR | Status: AC
  Filled 2015-01-06: qty 2

## 2015-01-06 MED ORDER — PROPOFOL 10 MG/ML IV BOLUS
INTRAVENOUS | Status: DC | PRN
  Administered 2015-01-06: 160 mg via INTRAVENOUS
  Administered 2015-01-06: 40 mg via INTRAVENOUS

## 2015-01-06 MED ORDER — KETOROLAC TROMETHAMINE 30 MG/ML IJ SOLN
INTRAMUSCULAR | Status: DC | PRN
  Administered 2015-01-06: 30 mg via INTRAVENOUS

## 2015-01-06 MED ORDER — LIDOCAINE HCL (CARDIAC) 20 MG/ML IV SOLN
INTRAVENOUS | Status: AC
  Filled 2015-01-06: qty 5

## 2015-01-06 MED ORDER — PROMETHAZINE HCL 25 MG/ML IJ SOLN: 6.2500 mg | INTRAMUSCULAR | Status: DC | PRN

## 2015-01-06 MED ORDER — FENTANYL CITRATE (PF) 100 MCG/2ML IJ SOLN
INTRAMUSCULAR | Status: AC
  Filled 2015-01-06: qty 4

## 2015-01-06 MED ORDER — FENTANYL CITRATE (PF) 100 MCG/2ML IJ SOLN: 25.0000 ug | INTRAMUSCULAR | Status: DC | PRN

## 2015-01-06 MED ORDER — DEXAMETHASONE SODIUM PHOSPHATE 10 MG/ML IJ SOLN
INTRAMUSCULAR | Status: DC | PRN
  Administered 2015-01-06: 4 mg via INTRAVENOUS

## 2015-01-06 MED ORDER — SCOPOLAMINE 1 MG/3DAYS TD PT72
MEDICATED_PATCH | TRANSDERMAL | Status: AC
  Administered 2015-01-06: 1.5 mg via TRANSDERMAL
  Filled 2015-01-06: qty 1

## 2015-01-06 MED ORDER — MIDAZOLAM HCL 2 MG/2ML IJ SOLN
INTRAMUSCULAR | Status: AC
  Filled 2015-01-06: qty 4

## 2015-01-06 MED ORDER — SCOPOLAMINE 1 MG/3DAYS TD PT72
1.0000 | MEDICATED_PATCH | Freq: Once | TRANSDERMAL | Status: DC
  Administered 2015-01-06: 1.5 mg via TRANSDERMAL

## 2015-01-06 MED ORDER — CEFAZOLIN SODIUM-DEXTROSE 2-3 GM-% IV SOLR
2.0000 g | INTRAVENOUS | Status: AC
  Administered 2015-01-06: 2 g via INTRAVENOUS

## 2015-01-06 MED ORDER — MIDAZOLAM HCL 2 MG/2ML IJ SOLN
INTRAMUSCULAR | Status: DC | PRN
  Administered 2015-01-06: 2 mg via INTRAVENOUS

## 2015-01-06 MED ORDER — KETOROLAC TROMETHAMINE 30 MG/ML IJ SOLN
INTRAMUSCULAR | Status: AC
  Filled 2015-01-06: qty 1

## 2015-01-06 MED ORDER — FENTANYL CITRATE (PF) 100 MCG/2ML IJ SOLN
INTRAMUSCULAR | Status: DC | PRN
  Administered 2015-01-06 (×2): 50 ug via INTRAVENOUS

## 2015-01-06 MED ORDER — OXYCODONE-ACETAMINOPHEN 7.5-325 MG PO TABS
1.0000 | ORAL_TABLET | ORAL | Status: DC | PRN
Start: 2015-01-06 — End: 2015-03-25

## 2015-01-06 MED ORDER — LACTATED RINGERS IV SOLN
INTRAVENOUS | Status: DC
  Administered 2015-01-06 (×2): via INTRAVENOUS

## 2015-01-06 MED ORDER — DEXAMETHASONE SODIUM PHOSPHATE 4 MG/ML IJ SOLN
INTRAMUSCULAR | Status: AC
  Filled 2015-01-06: qty 1

## 2015-01-06 SURGICAL SUPPLY — 17 items
CANISTER SUCT 3000ML (MISCELLANEOUS) ×3 IMPLANT
CATH ROBINSON RED A/P 16FR (CATHETERS) ×3 IMPLANT
CLOTH BEACON ORANGE TIMEOUT ST (SAFETY) ×3 IMPLANT
CONTAINER PREFILL 10% NBF 60ML (FORM) ×6 IMPLANT
ELECTRODE ROLLER VERSAPOINT (ELECTRODE) IMPLANT
ELECTRODE RT ANGLE VERSAPOINT (CUTTING LOOP) ×2 IMPLANT
GLOVE BIO SURGEON STRL SZ 6.5 (GLOVE) ×2 IMPLANT
GLOVE BIO SURGEONS STRL SZ 6.5 (GLOVE) ×1
GLOVE BIOGEL PI IND STRL 7.0 (GLOVE) ×2 IMPLANT
GLOVE BIOGEL PI INDICATOR 7.0 (GLOVE) ×4
GOWN STRL REUS W/TWL LRG LVL3 (GOWN DISPOSABLE) ×6 IMPLANT
PACK VAGINAL MINOR WOMEN LF (CUSTOM PROCEDURE TRAY) ×3 IMPLANT
PAD OB MATERNITY 4.3X12.25 (PERSONAL CARE ITEMS) ×3 IMPLANT
TOWEL OR 17X24 6PK STRL BLUE (TOWEL DISPOSABLE) ×6 IMPLANT
TUBING AQUILEX INFLOW (TUBING) ×3 IMPLANT
TUBING AQUILEX OUTFLOW (TUBING) ×3 IMPLANT
WATER STERILE IRR 1000ML POUR (IV SOLUTION) ×3 IMPLANT

## 2015-01-06 NOTE — Anesthesia Procedure Notes (Signed)
Procedure Name: LMA Insertion Date/Time: 01/06/2015 9:16 AM Performed by: Jonna Munro Pre-anesthesia Checklist: Patient identified, Emergency Drugs available, Suction available, Timeout performed and Patient being monitored Patient Re-evaluated:Patient Re-evaluated prior to inductionOxygen Delivery Method: Circle system utilized Preoxygenation: Pre-oxygenation with 100% oxygen Intubation Type: IV induction LMA: LMA inserted LMA Size: 4.0 Number of attempts: 1 Tube secured with: Tape Dental Injury: Teeth and Oropharynx as per pre-operative assessment

## 2015-01-06 NOTE — Transfer of Care (Signed)
Immediate Anesthesia Transfer of Care Note  Patient: Wendy Pineda  Procedure(s) Performed: Procedure(s): DILATATION & CURETTAGE/HYSTEROSCOPY WITH VERSAPOINT RESECTION (N/A)  Patient Location: PACU  Anesthesia Type:General  Level of Consciousness: awake, alert  and oriented  Airway & Oxygen Therapy: Patient Spontanous Breathing and Patient connected to nasal cannula oxygen  Post-op Assessment: Report given to RN and Post -op Vital signs reviewed and stable  Post vital signs: Reviewed and stable  Last Vitals:  Filed Vitals:   01/06/15 0755  BP: 139/91  Pulse: 69  Temp: 36.8 C  Resp: 18    Complications: No apparent anesthesia complications

## 2015-01-06 NOTE — Anesthesia Preprocedure Evaluation (Addendum)
Anesthesia Evaluation  Patient identified by MRN, date of birth, ID band Patient awake    Reviewed: Allergy & Precautions, NPO status , Patient's Chart, lab work & pertinent test results  History of Anesthesia Complications Negative for: history of anesthetic complications  Airway Mallampati: II  TM Distance: >3 FB Neck ROM: Full    Dental  (+) Teeth Intact, Dental Advisory Given   Pulmonary neg pulmonary ROS,    Pulmonary exam normal breath sounds clear to auscultation       Cardiovascular Exercise Tolerance: Good (-) hypertension(-) angina(-) Past MI Normal cardiovascular exam+ Valvular Problems/Murmurs MR  Rhythm:Regular Rate:Normal  echo 10/11/2007 EF >55%mild, MR   Neuro/Psych negative neurological ROS  negative psych ROS   GI/Hepatic Neg liver ROS, GERD  ,  Endo/Other  negative endocrine ROS  Renal/GU negative Renal ROS  Female GU complaint     Musculoskeletal negative musculoskeletal ROS (+)   Abdominal   Peds  Hematology  (+) Blood dyscrasia, anemia ,   Anesthesia Other Findings Day of surgery medications reviewed with the patient.  Reproductive/Obstetrics negative OB ROS                            Anesthesia Physical Anesthesia Plan  ASA: II  Anesthesia Plan: General   Post-op Pain Management:    Induction: Intravenous  Airway Management Planned: LMA  Additional Equipment:   Intra-op Plan:   Post-operative Plan: Extubation in OR  Informed Consent: I have reviewed the patients History and Physical, chart, labs and discussed the procedure including the risks, benefits and alternatives for the proposed anesthesia with the patient or authorized representative who has indicated his/her understanding and acceptance.   Dental advisory given  Plan Discussed with: CRNA  Anesthesia Plan Comments: (Risks/benefits of general anesthesia discussed with patient including risk  of damage to teeth, lips, gum, and tongue, nausea/vomiting, allergic reactions to medications, and the possibility of heart attack, stroke and death.  All patient questions answered.  Patient wishes to proceed.)       Anesthesia Quick Evaluation

## 2015-01-06 NOTE — H&P (Signed)
Wendy Pineda is an 49 y.o. female  G1P1 married  RP:  Menometro with SM myomas  Pertinent Gynecological History: Menses: flow is excessive with use of many pads or tampons on heaviest days Bleeding: intermenstrual bleeding Contraception: Natural method Blood transfusions: none Sexually transmitted diseases: no past history Last mammogram: normal Last pap: normal OB History:    Menstrual History:  Patient's last menstrual period was 01/01/2015.    Past Medical History  Diagnosis Date  . Heart murmur     echo 10/11/2007 EF >55%mild, MR  . Anemia   . GERD (gastroesophageal reflux disease)     occasional-tums prn-overindulgence  . Hx of chest pain     neg. lexiscan myoview 02/23/2011  . Hyperlipidemia     treated by diet  . Carotid bruit     carotid dopplers 12/22/2010, no hemodynamically significant stenosis    Past Surgical History  Procedure Laterality Date  . Dilation and curettage of uterus  2009    [facility]  . Colonoscopy      Family History  Problem Relation Age of Onset  . Stroke Maternal Grandmother   . Cancer Maternal Grandfather     Social History:  reports that she has never smoked. She has never used smokeless tobacco. She reports that she does not drink alcohol or use illicit drugs.  Allergies:  Allergies  Allergen Reactions  . Sulfa Antibiotics Hives and Swelling    Prescriptions prior to admission  Medication Sig Dispense Refill Last Dose  . B Complex Vitamins (B COMPLEX PO) Take 1 packet by mouth daily.   Past Week at Unknown time  . folic acid (FOLVITE) 440 MCG tablet Take 400 mcg by mouth daily.   Past Week at Unknown time  . IRON PO Take 1 tablet by mouth daily.   01/05/2015 at Unknown time  . Multiple Vitamins-Minerals (ANTIOXIDANT PO) Take 1 packet by mouth daily.   Past Week at Unknown time  . Omega-3 Fatty Acids (FISH OIL) 1000 MG CAPS Take by mouth.   Past Week at Unknown time  . Vitamin D, Cholecalciferol, 1000 UNITS TABS Take by mouth.    Past Month at Unknown time  . azithromycin (ZITHROMAX) 250 MG tablet As packaged 6 tablet 0 Not Taking  . HYDROcodone-homatropine (HYCODAN) 5-1.5 MG/5ML syrup Take 5 mLs by mouth every 6 (six) hours as needed for cough. 120 mL 0 Not Taking    ROS  Blood pressure 139/91, pulse 69, temperature 98.2 F (36.8 C), temperature source Oral, resp. rate 18, last menstrual period 01/01/2015, SpO2 100 %. Physical Exam   SonoHysto:  Fundal SM myoma 1.3 cm and IM/SM myoma ant protruding 1 cm.  Results for orders placed or performed during the hospital encounter of 01/06/15 (from the past 24 hour(s))  Pregnancy, urine     Status: None   Collection Time: 01/06/15  7:30 AM  Result Value Ref Range   Preg Test, Ur NEGATIVE NEGATIVE    Assessment/Plan: Menometro with SM myomas for HSC Resection, D+C.  Surgery and risks reviewed.  Avis Mcmahill,MARIE-LYNE 01/06/2015, 9:03 AM

## 2015-01-06 NOTE — Op Note (Signed)
01/06/2015  10:01 AM  PATIENT:  Wendy Pineda  49 y.o. female  PRE-OPERATIVE DIAGNOSIS:  Submucosal Myomas  POST-OPERATIVE DIAGNOSIS:  Submucosal Myomas  PROCEDURE:  Procedure(s): DILATATION & CURETTAGE/HYSTEROSCOPY WITH VERSAPOINT RESECTION  SURGEON:  Surgeon(s): Princess Bruins, MD  ASSISTANTS: none   ANESTHESIA:   general   PROCEDURE:  Under general anesthesia with laryngeal mask the patient is an lithotomy position.  She is prepped with Betadine on the suprapubic, vulvar and vaginal areas and draped as usual. The bladder was catheterized. The uterus is in anteverted position no adnexal mass. The speculum is inserted in the vagina and the anterior lip of the cervix is grasped with a tenaculum.  A paracervical block is done with Nesacaine 1% a total of 20 cc at 4 and 8:00. Dilation of the cervix with Hegar dilators up to #31 without difficulty.  The hysteroscope with the VersaPoint loop are inserted in the intrauterine cavity. Visualization of the intrauterine cavity including both ostia and the 2 submucosal myoma.  One of the submucosal myoma is completely in the cavity measuring about 1.5 cm at the left fundus.  The other myoma is anterior and just partially submucosal with a diameter of about 1.5 cm.  We starts with resection of the left fundal myoma which is completely resected.  We then resected the anterior myoma and go below the level of the endometrium.  We then use the coag mode to complete hemostasis at both resection beds.  All the pieces were removed from the cavity.  We then proceeded with the endometrial curettage on all surfaces with a sharp curet. Both specimens were sent together to pathology.  We went back to visualize the intrauterine cavity which appeared completely normal.  Pictures were taken before resection and after.  Hemostasis was adequate. All instruments were removed. The patient was brought to recovery room in good and stable status.  ESTIMATED BLOOD LOSS:  25  cc  FLUID DEFICIT:  1500 cc   Intake/Output Summary (Last 24 hours) at 01/06/15 1001 Last data filed at 01/06/15 0942  Gross per 24 hour  Intake   1000 ml  Output     70 ml  Net    930 ml     BLOOD ADMINISTERED:none   LOCAL MEDICATIONS USED:  Nesacaine 1% 20cc Paracervical block  SPECIMEN:  Source of Specimen:  Resection pieces of SM myomas and Endometrial curettings  DISPOSITION OF SPECIMEN:  PATHOLOGY  COUNTS:  YES  PLAN OF CARE: Transfer to PACU  Princess Bruins  MD  01/06/2015 at 10:01 am

## 2015-01-06 NOTE — Discharge Instructions (Addendum)
Hysteroscopy, Care After °Refer to this sheet in the next few weeks. These instructions provide you with information on caring for yourself after your procedure. Your health care provider may also give you more specific instructions. Your treatment has been planned according to current medical practices, but problems sometimes occur. Call your health care provider if you have any problems or questions after your procedure.  °WHAT TO EXPECT AFTER THE PROCEDURE °After your procedure, it is typical to have the following: °· You may have some cramping. This normally lasts for a couple days. °· You may have bleeding. This can vary from light spotting for a few days to menstrual-like bleeding for 3-7 days. °HOME CARE INSTRUCTIONS °· Rest for the first 1-2 days after the procedure. °· Only take over-the-counter or prescription medicines as directed by your health care provider. Do not take aspirin. It can increase the chances of bleeding. °· Take showers instead of baths for 2 weeks or as directed by your health care provider. °· Do not drive for 24 hours or as directed. °· Do not drink alcohol while taking pain medicine. °· Do not use tampons, douche, or have sexual intercourse for 2 weeks or until your health care provider says it is okay. °· Take your temperature twice a day for 4-5 days. Write it down each time. °· Follow your health care provider's advice about diet, exercise, and lifting. °· If you develop constipation, you may: °¨ Take a mild laxative if your health care provider approves. °¨ Add bran foods to your diet. °¨ Drink enough fluids to keep your urine clear or pale yellow. °· Try to have someone with you or available to you for the first 24-48 hours, especially if you were given a general anesthetic. °· Follow up with your health care provider as directed. °SEEK MEDICAL CARE IF: °· You feel dizzy or lightheaded. °· You feel sick to your stomach (nauseous). °· You have abnormal vaginal discharge. °· You  have a rash. °· You have pain that is not controlled with medicine. °SEEK IMMEDIATE MEDICAL CARE IF: °· You have bleeding that is heavier than a normal menstrual period. °· You have a fever. °· You have increasing cramps or pain, not controlled with medicine. °· You have new belly (abdominal) pain. °· You pass out. °· You have pain in the tops of your shoulders (shoulder strap areas). °· You have shortness of breath. °Document Released: 01/10/2013 Document Reviewed: 01/10/2013 °[employer]® Patient Information ©2015 [employer]. This information is not intended to replace advice given to you by your health care provider. Make sure you discuss any questions you have with your health care provider. ° ° ° ° °Post Anesthesia Home Care Instructions ° °Activity: °Get plenty of rest for the remainder of the day. A responsible adult should stay with you for 24 hours following the procedure.  °For the next 24 hours, DO NOT: °-Drive a car °-Operate machinery °-Drink alcoholic beverages °-Take any medication unless instructed by your physician °-Make any legal decisions or sign important papers. ° °Meals: °Start with liquid foods such as gelatin or soup. Progress to regular foods as tolerated. Avoid greasy, spicy, heavy foods. If nausea and/or vomiting occur, drink only clear liquids until the nausea and/or vomiting subsides. Call your physician if vomiting continues. ° °Special Instructions/Symptoms: °Your throat may feel dry or sore from the anesthesia or the breathing tube placed in your throat during surgery. If this causes discomfort, gargle with warm salt water. The discomfort should disappear   within 24 hours. ° °If you had a scopolamine patch placed behind your ear for the management of post- operative nausea and/or vomiting: ° °1. The medication in the patch is effective for 72 hours, after which it should be removed.  Wrap patch in a tissue and discard in the trash. Wash hands thoroughly with soap and water. °2. You  may remove the patch earlier than 72 hours if you experience unpleasant side effects which may include dry mouth, dizziness or visual disturbances. °3. Avoid touching the patch. Wash your hands with soap and water after contact with the patch. °  ° °

## 2015-01-06 NOTE — Discharge Summary (Signed)
  Physician Discharge Summary  Patient ID: Wendy Pineda MRN: [id number] DOB/AGE: July 07, 1965 49 y.o.  Admit date: 01/06/2015 Discharge date: 01/06/2015  Admission Diagnoses: Submucosal Myomas  Discharge Diagnoses: Submucosal Myomas        Active Problems:   * No active hospital problems. *   Discharged Condition: good  Hospital Course: Outpatient  Consults: None  Treatments: surgery: Hysteroscopy with Versapoint resection of SM myomas and D+C  Disposition: 01-Home or Self Care     Medication List    STOP taking these medications        azithromycin 250 MG tablet  Commonly known as:  ZITHROMAX     HYDROcodone-homatropine 5-1.5 MG/5ML syrup  Commonly known as:  HYCODAN      TAKE these medications        ANTIOXIDANT PO  Take 1 packet by mouth daily.     B COMPLEX PO  Take 1 packet by mouth daily.     Fish Oil 1000 MG Caps  Take by mouth.     folic acid 540 MCG tablet  Commonly known as:  FOLVITE  Take 400 mcg by mouth daily.     IRON PO  Take 1 tablet by mouth daily.     oxyCODONE-acetaminophen 7.5-325 MG tablet  Commonly known as:  PERCOCET  Take 1 tablet by mouth every 4 (four) hours as needed for severe pain.     Vitamin D (Cholecalciferol) 1000 UNITS Tabs  Take by mouth.           Follow-up Information    Follow up with Tranise Forrest,MARIE-LYNE, MD In 3 weeks.   Specialty:  Obstetrics and Gynecology   Contact information:   [place] [address] [phone]       Signed: Princess Bruins, MD 01/06/2015, 10:14 AM

## 2015-01-07 ENCOUNTER — Encounter (HOSPITAL_COMMUNITY): Payer: Self-pay | Admitting: Obstetrics & Gynecology

## 2015-01-07 NOTE — Anesthesia Postprocedure Evaluation (Signed)
  Anesthesia Post-op Note  Patient: Wendy Pineda  Procedure(s) Performed: Procedure(s) (LRB): DILATATION & CURETTAGE/HYSTEROSCOPY WITH VERSAPOINT RESECTION (N/A)  Patient Location: PACU  Anesthesia Type: General  Level of Consciousness: awake and alert   Airway and Oxygen Therapy: Patient Spontanous Breathing  Post-op Pain: mild  Post-op Assessment: Post-op Vital signs reviewed, Patient's Cardiovascular Status Stable, Respiratory Function Stable, Patent Airway and No signs of Nausea or vomiting  Last Vitals:  Filed Vitals:   01/06/15 1145  BP: 153/91  Pulse: 68  Temp: 36.1 C  Resp: 12    Post-op Vital Signs: stable   Complications: No apparent anesthesia complications

## 2015-03-25 ENCOUNTER — Ambulatory Visit (INDEPENDENT_AMBULATORY_CARE_PROVIDER_SITE_OTHER): Payer: 59 | Admitting: Family Medicine

## 2015-03-25 VITALS — BP 132/80 | HR 85 | Temp 98.3°F | Resp 18 | Ht 67.0 in | Wt 185.6 lb

## 2015-03-25 DIAGNOSIS — J029 Acute pharyngitis, unspecified: Secondary | ICD-10-CM | POA: Diagnosis not present

## 2015-03-25 DIAGNOSIS — R6883 Chills (without fever): Secondary | ICD-10-CM | POA: Diagnosis not present

## 2015-03-25 DIAGNOSIS — R05 Cough: Secondary | ICD-10-CM

## 2015-03-25 DIAGNOSIS — R5381 Other malaise: Secondary | ICD-10-CM

## 2015-03-25 DIAGNOSIS — R059 Cough, unspecified: Secondary | ICD-10-CM

## 2015-03-25 LAB — POCT INFLUENZA A/B
INFLUENZA A, POC: NEGATIVE
Influenza B, POC: NEGATIVE

## 2015-03-25 LAB — POCT RAPID STREP A (OFFICE): Rapid Strep A Screen: NEGATIVE

## 2015-03-25 MED ORDER — OSELTAMIVIR PHOSPHATE 75 MG PO CAPS: 75.0000 mg | ORAL_CAPSULE | Freq: Two times a day (BID) | ORAL | Status: DC

## 2015-03-25 MED ORDER — HYDROCODONE-HOMATROPINE 5-1.5 MG/5ML PO SYRP
5.0000 mL | ORAL_SOLUTION | Freq: Three times a day (TID) | ORAL | Status: DC | PRN
Start: 2015-03-25 — End: 2015-06-27

## 2015-03-25 NOTE — Progress Notes (Signed)
[facility] [address], [place] [address] [phone]  Date:  03/25/2015   Name:  Wendy Pineda   DOB:  August 01, 1965   MRN:  [id number]  PCP:  Wendy Greenland, MD    Chief Complaint: Sore Throat   History of Present Illness:  Wendy Pineda is a 49 y.o. very pleasant female patient who presents with the following:  Her son was seen recently and was dx with strep on culture- rapid was negative.  Today is Tuesday- she got sick on Saturday.  Notes that her left ear hurts and her throat hurts. She has noted a cough, her neck is sore, and she notes chills and aches.  She is not sleeping due to cough, feels very tired.  She did vomit yesterday but not today, no abd pain  She has had subjective fever but ok today at clinic.  She has tried some OTC cold and flu medications   She is generally in good health  There are no active problems to display for this patient.   Past Medical History  Diagnosis Date  . Heart murmur     echo 10/11/2007 EF >55%mild, MR  . Anemia   . GERD (gastroesophageal reflux disease)     occasional-tums prn-overindulgence  . Hx of chest pain     neg. lexiscan myoview 02/23/2011  . Hyperlipidemia     treated by diet  . Carotid bruit     carotid dopplers 12/22/2010, no hemodynamically significant stenosis    Past Surgical History  Procedure Laterality Date  . Dilation and curettage of uterus  2009    [facility]  . Colonoscopy    . Dilitation & currettage/hystroscopy with versapoint resection N/A 01/06/2015    Procedure: DILATATION & CURETTAGE/HYSTEROSCOPY WITH VERSAPOINT RESECTION;  Surgeon: Princess Bruins, MD;  Location: [facility];  Service: Gynecology;  Laterality: N/A;    Social History  Substance Use Topics  . Smoking status: Never Smoker   . Smokeless tobacco: Never Used  . Alcohol Use: No    Family History  Problem Relation Age of Onset  . Stroke Maternal Grandmother   . Cancer Maternal Grandfather     Allergies  Allergen  Reactions  . Sulfa Antibiotics Hives and Swelling    Medication list has been reviewed and updated.  Current Outpatient Prescriptions on File Prior to Visit  Medication Sig Dispense Refill  . B Complex Vitamins (B COMPLEX PO) Take 1 packet by mouth daily.    . folic acid (FOLVITE) A999333 MCG tablet Take 400 mcg by mouth daily.    . IRON PO Take 1 tablet by mouth daily.    . Multiple Vitamins-Minerals (ANTIOXIDANT PO) Take 1 packet by mouth daily.    . Omega-3 Fatty Acids (FISH OIL) 1000 MG CAPS Take by mouth.    . Vitamin D, Cholecalciferol, 1000 UNITS TABS Take by mouth.    . oxyCODONE-acetaminophen (PERCOCET) 7.5-325 MG tablet Take 1 tablet by mouth every 4 (four) hours as needed for severe pain. (Patient not taking: Reported on 03/25/2015) 20 tablet 0   No current facility-administered medications on file prior to visit.    Review of Systems:  As per HPI- otherwise negative.   Physical Examination: Filed Vitals:   03/25/15 1306  BP: 132/80  Pulse: 85  Temp: 98.3 F (36.8 C)  Resp: 18   Filed Vitals:   03/25/15 1306  Height: 5\' 7"  (1.702 m)  Weight: 185 lb 9.6 oz (84.188 kg)  Body mass index is 29.06 kg/(m^2). Ideal Body Weight: Weight in (lb) to have BMI = 25: 159.3  GEN: WDWN, NAD, Non-toxic, A & O x 3, overweight, looks well HEENT: Atraumatic, Normocephalic. Neck supple. No masses, No LAD.  Bilateral TM wnl, oropharynx normal.  PEERL,EOMI.   Ears and Nose: No external deformity. CV: RRR, No M/G/R. No JVD. No thrill. No extra heart sounds. PULM: CTA B, no wheezes, crackles, rhonchi. No retractions. No resp. distress. No accessory muscle use. ABD: S, NT, ND EXTR: No c/c/e NEURO Normal gait.  PSYCH: Normally interactive. Conversant. Not depressed or anxious appearing.  Calm demeanor.   Results for orders placed or performed in visit on 03/25/15  POCT Influenza A/B  Result Value Ref Range   Influenza A, POC Negative Negative   Influenza B, POC Negative Negative   POCT rapid strep A  Result Value Ref Range   Rapid Strep A Screen Negative Negative    Assessment and Plan: Sore throat - Plan: POCT Influenza A/B, POCT rapid strep A, Culture, Group A Strep, oseltamivir (TAMIFLU) 75 MG capsule  Malaise - Plan: POCT Influenza A/B, oseltamivir (TAMIFLU) 75 MG capsule  Chills - Plan: POCT Influenza A/B, oseltamivir (TAMIFLU) 75 MG capsule  Cough - Plan: POCT Influenza A/B, oseltamivir (TAMIFLU) 75 MG capsule, HYDROcodone-homatropine (HYCODAN) 5-1.5 MG/5ML syrup  Treat for possible flu with tamiflu and cough syrup as needed.  She has been exposed to strep but her exam and sx do not really suggest strep- await culture She will let me know if not feeling better-.Sooner if worse.   Advised regarding sedation with cough syrup- do not drive on this medication  Signed Lamar Blinks, MD

## 2015-03-25 NOTE — Patient Instructions (Signed)
You likely have a cold- however it is also possible that you have the flu.  Use the tamiflu as directed and the cough syrup as needed for cough.   Let me know if you are not feeling better in the next couple of days and I will be in touch with your throat culture results

## 2015-03-26 ENCOUNTER — Telehealth: Payer: Self-pay

## 2015-03-26 DIAGNOSIS — J029 Acute pharyngitis, unspecified: Secondary | ICD-10-CM

## 2015-03-26 MED ORDER — AZITHROMYCIN 250 MG PO TABS: ORAL_TABLET | ORAL | Status: DC

## 2015-03-26 NOTE — Telephone Encounter (Signed)
Called her back- she states that she "feels like she has strep, and when I cough I am bringing up thick chunks." Her right ear also hurts.  Her throat culture is pending but appears negative so far.  She would like to try an abx which is ok with me- zpack is well tolerated.  Not currently pregnant.  Sent in zpack rx for her

## 2015-03-26 NOTE — Telephone Encounter (Signed)
Dr. Lorelei Pont   This Pt. Called in today wanting the results of her strep culture but I saw we still do not have a result note from you yet. I just informed her once you review it then we can let her know of the results. She wants you to know that she has been coughing a lot and she noticed some blood in the tissue after she coughs.She left her phone number if you wanted to call her [phone]

## 2015-03-27 ENCOUNTER — Telehealth: Payer: Self-pay

## 2015-03-27 LAB — CULTURE, GROUP A STREP: Organism ID, Bacteria: NORMAL

## 2015-03-27 NOTE — Telephone Encounter (Signed)
I spoke with her yesterday and we started azithromycin for her. Her throat culture is finally back- it is negative.  Called her home number and LMOM, called her cell and reached her.  She is actually feeling better overall but is concerned that there is blood tinge in her mucus when she blows her nose and sometimes when she coughs.  Reassured that this is likely coming from her nose and sinuses but please let us know if it persists

## 2015-03-27 NOTE — Telephone Encounter (Signed)
Patient is returning a missed phone call for lab results. Patient is very sick and states that she would like a phone call back today!

## 2015-03-29 ENCOUNTER — Telehealth: Payer: Self-pay | Admitting: Family Medicine

## 2015-03-29 NOTE — Telephone Encounter (Signed)
Received a call from the answering service that she had called.  I had called her in a zpack 2 days ago Called the cell number that she had given- no answer.  Called her home number- no answer.  Called her cell number back and was able to reach her- she does not have a fever, but feels like she is tired, like her skin is cold, like she looks pale and describes a feeling of "my air going right through me.'  Advised her that as she is already on abx there is not a lot I can do without seeing her.  Our office was open till 3 today but we are now closed.  Advised her that if she feels ill and believes that she needs to be seen please go to the ER- if she is not that ill we are glad to see her Monday morning

## 2015-06-22 ENCOUNTER — Telehealth: Payer: Self-pay | Admitting: *Deleted

## 2015-06-22 NOTE — Telephone Encounter (Signed)
Advised patient she will need to be RTC for evaluation.  Patient understood

## 2015-06-27 ENCOUNTER — Ambulatory Visit (INDEPENDENT_AMBULATORY_CARE_PROVIDER_SITE_OTHER): Payer: 59 | Admitting: Physician Assistant

## 2015-06-27 VITALS — BP 136/88 | HR 77 | Temp 98.8°F | Resp 18 | Ht 67.0 in | Wt 182.2 lb

## 2015-06-27 DIAGNOSIS — R059 Cough, unspecified: Secondary | ICD-10-CM

## 2015-06-27 DIAGNOSIS — R05 Cough: Secondary | ICD-10-CM | POA: Diagnosis not present

## 2015-06-27 DIAGNOSIS — J111 Influenza due to unidentified influenza virus with other respiratory manifestations: Secondary | ICD-10-CM | POA: Diagnosis not present

## 2015-06-27 DIAGNOSIS — R69 Illness, unspecified: Secondary | ICD-10-CM

## 2015-06-27 LAB — POCT CBC
Granulocyte percent: 42.9 %G (ref 37–80)
HCT, POC: 30.5 % — AB (ref 37.7–47.9)
Hemoglobin: 10.2 g/dL — AB (ref 12.2–16.2)
LYMPH, POC: 1.9 (ref 0.6–3.4)
MCH, POC: 21.1 pg — AB (ref 27–31.2)
MCHC: 33.4 g/dL (ref 31.8–35.4)
MCV: 63.3 fL — AB (ref 80–97)
MID (CBC): 0.3 (ref 0–0.9)
MPV: 7.6 fL (ref 0–99.8)
PLATELET COUNT, POC: 415 10*3/uL (ref 142–424)
POC Granulocyte: 1.7 — AB (ref 2–6.9)
POC LYMPH %: 48.3 % (ref 10–50)
POC MID %: 8.8 %M (ref 0–12)
RBC: 4.81 M/uL (ref 4.04–5.48)
RDW, POC: 18.8 %
WBC: 3.9 10*3/uL — AB (ref 4.6–10.2)

## 2015-06-27 MED ORDER — CETIRIZINE-PSEUDOEPHEDRINE ER 5-120 MG PO TB12: 1.0000 | ORAL_TABLET | Freq: Every morning | ORAL | Status: DC

## 2015-06-27 MED ORDER — HYDROCODONE-HOMATROPINE 5-1.5 MG/5ML PO SYRP: 2.5000 mL | ORAL_SOLUTION | Freq: Every evening | ORAL | Status: DC | PRN

## 2015-06-27 MED ORDER — NAPROXEN 500 MG PO TABS: 500.0000 mg | ORAL_TABLET | Freq: Two times a day (BID) | ORAL | Status: DC

## 2015-06-27 MED ORDER — IPRATROPIUM BROMIDE 0.02 % IN SOLN
0.5000 mg | Freq: Once | RESPIRATORY_TRACT | Status: AC
  Administered 2015-06-27: 0.5 mg via RESPIRATORY_TRACT

## 2015-06-27 MED ORDER — ALBUTEROL SULFATE (2.5 MG/3ML) 0.083% IN NEBU
2.5000 mg | INHALATION_SOLUTION | Freq: Once | RESPIRATORY_TRACT | Status: AC
  Administered 2015-06-27: 2.5 mg via RESPIRATORY_TRACT

## 2015-06-27 NOTE — Patient Instructions (Signed)
     IF you received an x-ray today, you will receive an invoice from [facility]. Please contact  Radiology at [phone] with questions or concerns regarding your invoice.   IF you received labwork today, you will receive an invoice from [facility]. Please contact [facility] at [phone] with questions or concerns regarding your invoice.   Our billing staff will not be able to assist you with questions regarding bills from these companies.  You will be contacted with the lab results as soon as they are available. The fastest way to get your results is to activate your My Chart account. Instructions are located on the last page of this paperwork. If you have not heard from us regarding the results in 2 weeks, please contact this office.      

## 2015-06-27 NOTE — Progress Notes (Signed)
06/27/2015 7:47 PM   DOB: 1966-03-27 / MRN: [id number]  SUBJECTIVE:  Wendy Pineda is a 50 y.o. female presenting for cough, fever, chills x 9 days.  Reports she has been exposed to pneumonia from her trainer.  Associates some SOB with stair climbing, and also complains of overwhelming fatigue.  She has no had the flu shot.  Appetite has been overall poor but is improving as of the last 24 hours.  Has been taking OTC pain reliever with minimal relief.  Feels that she is getting better.    She is allergic to sulfa antibiotics.   She  has a past medical history of Heart murmur; Anemia; GERD (gastroesophageal reflux disease); chest pain; Hyperlipidemia; and Carotid bruit.    She  reports that she has never smoked. She has never used smokeless tobacco. She reports that she does not drink alcohol or use illicit drugs. She  reports that she currently engages in sexual activity. She reports using the following method of birth control/protection: None. The patient  has past surgical history that includes Dilation and curettage of uterus (2009); colonoscopy; and Dilatation & currettage/hysteroscopy with versapoint resection (N/A, 01/06/2015).  Her family history includes Cancer in her maternal grandfather; Stroke in her maternal grandmother.  Review of Systems  Constitutional: Negative for fever.  Respiratory: Negative for cough.   Cardiovascular: Negative for chest pain.  Gastrointestinal: Negative for nausea.  Skin: Negative for rash.  Neurological: Negative for dizziness and headaches.    Problem list and medications reviewed and updated by myself where necessary, and exist elsewhere in the encounter.   OBJECTIVE:  BP 136/88 mmHg  Pulse 77  Temp(Src) 98.8 F (37.1 C)  Resp 18  Ht 5\' 7"  (1.702 m)  Wt 182 lb 3.2 oz (82.645 kg)  BMI 28.53 kg/m2  SpO2 98%  LMP 06/25/2015  Physical Exam  Constitutional: She is oriented to person, place, and time.  HENT:  Right Ear: External ear normal.    Left Ear: External ear normal.  Nose: Mucosal edema present. Right sinus exhibits no maxillary sinus tenderness and no frontal sinus tenderness. Left sinus exhibits no maxillary sinus tenderness and no frontal sinus tenderness.  Mouth/Throat: Oropharynx is clear and moist. No oropharyngeal exudate.  Eyes: Conjunctivae are normal. Pupils are equal, round, and reactive to light.  Cardiovascular: Regular rhythm and normal heart sounds.   Pulmonary/Chest: Effort normal and breath sounds normal. She has no rales.  Neurological: She is alert and oriented to person, place, and time.  Skin: Skin is warm and dry. No rash noted. She is not diaphoretic. No erythema.  Psychiatric: Her behavior is normal.    Results for orders placed or performed in visit on 06/27/15 (from the past 72 hour(s))  POCT CBC     Status: Abnormal   Collection Time: 06/27/15  7:40 PM  Result Value Ref Range   WBC 3.9 (A) 4.6 - 10.2 K/uL   Lymph, poc 1.9 0.6 - 3.4   POC LYMPH PERCENT 48.3 10 - 50 %L   MID (cbc) 0.3 0 - 0.9   POC MID % 8.8 0 - 12 %M   POC Granulocyte 1.7 (A) 2 - 6.9   Granulocyte percent 42.9 37 - 80 %G   RBC 4.81 4.04 - 5.48 M/uL   Hemoglobin 10.2 (A) 12.2 - 16.2 g/dL   HCT, POC 30.5 (A) 37.7 - 47.9 %   MCV 63.3 (A) 80 - 97 fL   MCH, POC 21.1 (A) 27 - 31.2  pg   MCHC 33.4 31.8 - 35.4 g/dL   RDW, POC 18.8 %   Platelet Count, POC 415 142 - 424 K/uL   MPV 7.6 0 - 99.8 fL    No results found.  ASSESSMENT AND PLAN  [name] was seen today for cough, shortness of breath and headache.  Diagnoses and all orders for this visit:  Cough: Exam and CBC consistent with the flu.  She is well beyond the 48 hour treatment window for Tamiflu. Will treat her symptomatically and advised that she return in 48 hours if her symptoms change or worsen. Hycodan, Zyrtec-D, and Naprosyn 500 for now.    -     POCT CBC -     albuterol (PROVENTIL) (2.5 MG/3ML) 0.083% nebulizer solution 2.5 mg; Take 3 mLs (2.5 mg total) by  nebulization once. -     ipratropium (ATROVENT) nebulizer solution 0.5 mg; Take 2.5 mLs (0.5 mg total) by nebulization once.  Influenza-like illness   The patient was advised to call or return to clinic if she does not see an improvement in symptoms or to seek the care of the closest emergency department if she worsens with the above plan.   Philis Fendt, MHS, PA-C [facility] 06/27/2015 7:47 PM

## 2016-05-14 ENCOUNTER — Ambulatory Visit
Admission: RE | Admit: 2016-05-14 | Discharge: 2016-05-14 | Disposition: A | Discharge status: Home / Self Care | Payer: 59 | Source: Ambulatory Visit | Attending: Obstetrics & Gynecology | Admitting: Obstetrics & Gynecology

## 2016-05-14 ENCOUNTER — Other Ambulatory Visit: Payer: Self-pay | Admitting: Obstetrics & Gynecology

## 2016-05-14 DIAGNOSIS — N63 Unspecified lump in unspecified breast: Secondary | ICD-10-CM

## 2016-05-14 DIAGNOSIS — N6310 Unspecified lump in the right breast, unspecified quadrant: Secondary | ICD-10-CM | POA: Diagnosis not present

## 2016-05-14 DIAGNOSIS — Z01419 Encounter for gynecological examination (general) (routine) without abnormal findings: Secondary | ICD-10-CM | POA: Diagnosis not present

## 2016-08-25 DIAGNOSIS — N926 Irregular menstruation, unspecified: Secondary | ICD-10-CM | POA: Diagnosis not present

## 2016-08-25 DIAGNOSIS — Z118 Encounter for screening for other infectious and parasitic diseases: Secondary | ICD-10-CM | POA: Diagnosis not present

## 2016-08-25 DIAGNOSIS — Z1159 Encounter for screening for other viral diseases: Secondary | ICD-10-CM | POA: Diagnosis not present

## 2016-08-25 DIAGNOSIS — R102 Pelvic and perineal pain: Secondary | ICD-10-CM | POA: Diagnosis not present

## 2016-10-02 DIAGNOSIS — J069 Acute upper respiratory infection, unspecified: Secondary | ICD-10-CM | POA: Diagnosis not present

## 2016-10-26 DIAGNOSIS — N920 Excessive and frequent menstruation with regular cycle: Secondary | ICD-10-CM | POA: Diagnosis not present

## 2017-02-09 DIAGNOSIS — Z118 Encounter for screening for other infectious and parasitic diseases: Secondary | ICD-10-CM | POA: Diagnosis not present

## 2017-07-12 LAB — LIPID PANEL
CHOLESTEROL: 287 — AB (ref 0–200)
HDL: 89 — AB (ref 35–70)
LDL CALC: 181

## 2017-07-12 LAB — CBC AND DIFFERENTIAL
HEMATOCRIT: 37 (ref 36–46)
HEMOGLOBIN: 11.7 — AB (ref 12.0–16.0)
Platelets: 410 — AB (ref 150–399)

## 2017-07-12 LAB — IRON,TIBC AND FERRITIN PANEL
FERRITIN: 8
Iron: 31
TIBC: 485
UIBC: 454

## 2017-12-24 ENCOUNTER — Encounter: Payer: Self-pay | Admitting: Nurse Practitioner

## 2017-12-24 DIAGNOSIS — N760 Acute vaginitis: Secondary | ICD-10-CM | POA: Insufficient documentation

## 2017-12-24 DIAGNOSIS — E7800 Pure hypercholesterolemia, unspecified: Secondary | ICD-10-CM | POA: Insufficient documentation

## 2017-12-24 DIAGNOSIS — J029 Acute pharyngitis, unspecified: Secondary | ICD-10-CM

## 2017-12-24 DIAGNOSIS — D508 Other iron deficiency anemias: Secondary | ICD-10-CM

## 2017-12-24 DIAGNOSIS — D649 Anemia, unspecified: Secondary | ICD-10-CM | POA: Insufficient documentation

## 2018-01-13 ENCOUNTER — Encounter: Payer: 59 | Admitting: Nurse Practitioner

## 2018-03-14 ENCOUNTER — Ambulatory Visit: Payer: 59 | Admitting: Emergency Medicine

## 2018-03-14 ENCOUNTER — Other Ambulatory Visit: Payer: Self-pay

## 2018-03-14 ENCOUNTER — Encounter: Payer: Self-pay | Admitting: Emergency Medicine

## 2018-03-14 VITALS — BP 134/87 | HR 70 | Temp 98.5°F | Resp 18 | Ht 67.0 in | Wt 199.0 lb

## 2018-03-14 DIAGNOSIS — W19XXXA Unspecified fall, initial encounter: Secondary | ICD-10-CM | POA: Diagnosis not present

## 2018-03-14 DIAGNOSIS — T07XXXA Unspecified multiple injuries, initial encounter: Secondary | ICD-10-CM | POA: Diagnosis not present

## 2018-03-14 NOTE — Patient Instructions (Addendum)
     If you have lab work done today you will be contacted with your lab results within the next 2 weeks.  If you have not heard from [place] then please contact us. The fastest way to get your results is to register for My Chart.   IF you received an x-ray today, you will receive an invoice from [facility]. Please contact [facility] Radiology at [phone] with questions or concerns regarding your invoice.   IF you received labwork today, you will receive an invoice from [place]. Please contact [facility] at [phone] with questions or concerns regarding your invoice.   Our billing staff will not be able to assist you with questions regarding bills from these companies.  You will be contacted with the lab results as soon as they are available. The fastest way to get your results is to activate your My Chart account. Instructions are located on the last page of this paperwork. If you have not heard from [place] regarding the results in 2 weeks, please contact this office.     Contusion A contusion is a deep bruise. Contusions happen when an injury causes bleeding under the skin. Symptoms of bruising include pain, swelling, and discolored skin. The skin may turn blue, purple, or yellow. Follow these instructions at home:  Rest the injured area.  If told, put ice on the injured area. ? Put ice in a plastic bag. ? Place a towel between your skin and the bag. ? Leave the ice on for 20 minutes, 2-3 times per day.  If told, put light pressure (compression) on the injured area using an elastic bandage. Make sure the bandage is not too tight. Remove it and put it back on as told by your doctor.  If possible, raise (elevate) the injured area above the level of your heart while you are sitting or lying down.  Take over-the-counter and prescription medicines only as told by your doctor. Contact a doctor if:  Your symptoms do not get better after several days of treatment.  Your symptoms  get worse.  You have trouble moving the injured area. Get help right away if:  You have very bad pain.  You have a loss of feeling (numbness) in a hand or foot.  Your hand or foot turns pale or cold. This information is not intended to replace advice given to you by your health care provider. Make sure you discuss any questions you have with your health care provider. Document Released: 09/08/2007 Document Revised: 08/28/2015 Document Reviewed: 08/07/2014 Elsevier Interactive Patient Education  2018 [employer].

## 2018-03-14 NOTE — Progress Notes (Signed)
Wendy Pineda 52 y.o.   Chief Complaint  Patient presents with  . Fall    landed on knees and right elbow and both feet, back and hips fell thanksgiving     HISTORY OF PRESENT ILLNESS: This is a 52 y.o. female fell on Thanksgiving day, 12 days ago, and landed on her knees.  No head injury.  No LOC.  Also injured elbows, low back, and hips.  Ambulatory okay.  Today feels better than several days ago.  No other injuries or significant symptoms.  HPI   Prior to Admission medications   Medication Sig Start Date End Date Taking? Authorizing Provider  B Complex Vitamins (B COMPLEX PO) Take 1 packet by mouth daily. Reported on 06/27/2015   Yes [provider]  folic acid (FOLVITE) 786 MCG tablet Take 400 mcg by mouth daily. Reported on 06/27/2015   Yes [provider]  IRON PO Take 1 tablet by mouth daily. Reported on 06/27/2015   Yes [provider]  Multiple Vitamins-Minerals (ANTIOXIDANT PO) Take 1 packet by mouth daily. Reported on 06/27/2015   Yes [provider]  Omega-3 Fatty Acids (FISH OIL) 1000 MG CAPS Take by mouth. Reported on 06/27/2015   Yes [provider]  HYDROcodone-homatropine (HYCODAN) 5-1.5 MG/5ML syrup Take 2.5-5 mLs by mouth at bedtime as needed. Patient not taking: Reported on 03/14/2018 06/27/15   Tereasa Coop, PA-C    Allergies  Allergen Reactions  . Sulfa Antibiotics Hives and Swelling  . Sulfur     Patient Active Problem List   Diagnosis Date Noted  . Hypercholesteremia 12/24/2017  . Vaginitis 12/24/2017  . Anemia 12/24/2017  . Acute pharyngitis 12/24/2017    Past Medical History:  Diagnosis Date  . Anemia   . Carotid bruit    carotid dopplers 12/22/2010, no hemodynamically significant stenosis  . GERD (gastroesophageal reflux disease)    occasional-tums prn-overindulgence  . Heart murmur    echo 10/11/2007 EF >55%mild, MR  . Hx of chest pain    neg. lexiscan myoview 02/23/2011  . Hyperlipidemia    treated  by diet    Past Surgical History:  Procedure Laterality Date  . colonoscopy    . DILATION AND CURETTAGE OF UTERUS  2009   [facility]  . DILITATION & CURRETTAGE/HYSTROSCOPY WITH VERSAPOINT RESECTION N/A 01/06/2015   Procedure: DILATATION & CURETTAGE/HYSTEROSCOPY WITH VERSAPOINT RESECTION;  Surgeon: Princess Bruins, MD;  Location: [facility];  Service: Gynecology;  Laterality: N/A;    Social History   Socioeconomic History  . Marital status: Married    Spouse name: Not on file  . Number of children: 1  . Years of education: Not on file  . Highest education level: Not on file  Occupational History  . Occupation: case worker    Comment: [employer]  Social Needs  . Financial resource strain: Not on file  . Food insecurity:    Worry: Not on file    Inability: Not on file  . Transportation needs:    Medical: Not on file    Non-medical: Not on file  Tobacco Use  . Smoking status: Never Smoker  . Smokeless tobacco: Never Used  Substance and Sexual Activity  . Alcohol use: No  . Drug use: No  . Sexual activity: Yes    Birth control/protection: None  Lifestyle  . Physical activity:    Days per week: Not on file    Minutes per session: Not on file  . Stress: Not on file  Relationships  . Social connections:    Talks on phone: Not on file    Gets together: Not on file    Attends religious service: Not on file    Active member of club or organization: Not on file    Attends meetings of clubs or organizations: Not on file    Relationship status: Not on file  . Intimate partner violence:    Fear of current or ex partner: Not on file    Emotionally abused: Not on file    Physically abused: Not on file    Forced sexual activity: Not on file  Other Topics Concern  . Not on file  Social History Narrative  . Not on file    Family History  Problem Relation Age of Onset  . Breast cancer Mother   . Stroke Maternal Grandmother   . Cancer Maternal Grandfather      Review  of Systems  Constitutional: Negative.  Negative for chills and fever.  HENT: Negative.   Eyes: Negative.  Negative for blurred vision and double vision.  Respiratory: Negative.  Negative for cough and shortness of breath.   Cardiovascular: Negative.  Negative for chest pain and palpitations.  Gastrointestinal: Negative.  Negative for abdominal pain, diarrhea, nausea and vomiting.  Genitourinary: Negative.  Negative for dysuria.  Musculoskeletal: Positive for back pain and joint pain.  Skin: Negative.  Negative for rash.  Neurological: Negative.  Negative for dizziness and headaches.  Endo/Heme/Allergies: Negative.   All other systems reviewed and are negative.   Vitals:   03/14/18 1111  BP: 134/87  Pulse: 70  Resp: 18  Temp: 98.5 F (36.9 C)  SpO2: 95%    Physical Exam  Constitutional: She is oriented to person, place, and time. She appears well-developed and well-nourished.  HENT:  Head: Normocephalic and atraumatic.  Nose: Nose normal.  Mouth/Throat: Oropharynx is clear and moist.  Eyes: Pupils are equal, round, and reactive to light. Conjunctivae and EOM are normal.  Neck: Normal range of motion. Neck supple.  Cardiovascular: Normal rate, regular rhythm and normal heart sounds.  Pulmonary/Chest: Effort normal and breath sounds normal. She exhibits no tenderness.  Abdominal: Soft. She exhibits no distension. There is no tenderness.  Musculoskeletal:       Right shoulder: Normal.       Left shoulder: Normal.       Right elbow: Normal.      Left elbow: Normal.       Right wrist: Normal.       Left wrist: Normal.       Right hip: She exhibits tenderness. She exhibits normal range of motion.       Left hip: Normal.       Right knee: She exhibits swelling. She exhibits normal range of motion, no ecchymosis, no deformity, no erythema, normal alignment, no LCL laxity, normal patellar mobility and no bony tenderness. No tenderness found.       Left knee: She exhibits swelling.  She exhibits normal range of motion, no ecchymosis, no erythema, normal alignment and no bony tenderness. No tenderness found.       Right ankle: Normal.       Left ankle: Normal.       Lumbar back: She exhibits decreased range of motion and tenderness. She exhibits no bony tenderness, no spasm and normal pulse.  Neurological: She is alert and oriented to person, place, and time.  Skin: Skin is warm and dry. Capillary refill takes less than 2  seconds.  Psychiatric: She has a normal mood and affect. Her behavior is normal.  Vitals reviewed.  A total of 25 minutes was spent in the room with the patient, greater than 50% of which was in counseling/coordination of care regarding diagnosis of multiple contusions, treatment, medications, prognosis, and need for follow-up if no better or worse in 1 to 2 weeks.   ASSESSMENT & PLAN: Samani was seen today for fall.  Diagnoses and all orders for this visit:  Multiple contusions  Accidental fall, initial encounter     Patient Instructions       If you have lab work done today you will be contacted with your lab results within the next 2 weeks.  If you have not heard from [place] then please contact us. The fastest way to get your results is to register for My Chart.   IF you received an x-ray today, you will receive an invoice from [facility] Radiology. Please contact [facility] Radiology at [phone] with questions or concerns regarding your invoice.   IF you received labwork today, you will receive an invoice from [place]. Please contact [facility] at [phone] with questions or concerns regarding your invoice.   Our billing staff will not be able to assist you with questions regarding bills from these companies.  You will be contacted with the lab results as soon as they are available. The fastest way to get your results is to activate your My Chart account. Instructions are located on the last page of this paperwork. If you have not  heard from [place] regarding the results in 2 weeks, please contact this office.     Contusion A contusion is a deep bruise. Contusions happen when an injury causes bleeding under the skin. Symptoms of bruising include pain, swelling, and discolored skin. The skin may turn blue, purple, or yellow. Follow these instructions at home:  Rest the injured area.  If told, put ice on the injured area. ? Put ice in a plastic bag. ? Place a towel between your skin and the bag. ? Leave the ice on for 20 minutes, 2-3 times per day.  If told, put light pressure (compression) on the injured area using an elastic bandage. Make sure the bandage is not too tight. Remove it and put it back on as told by your doctor.  If possible, raise (elevate) the injured area above the level of your heart while you are sitting or lying down.  Take over-the-counter and prescription medicines only as told by your doctor. Contact a doctor if:  Your symptoms do not get better after several days of treatment.  Your symptoms get worse.  You have trouble moving the injured area. Get help right away if:  You have very bad pain.  You have a loss of feeling (numbness) in a hand or foot.  Your hand or foot turns pale or cold. This information is not intended to replace advice given to you by your health care provider. Make sure you discuss any questions you have with your health care provider. Document Released: 09/08/2007 Document Revised: 08/28/2015 Document Reviewed: 08/07/2014 Elsevier Interactive Patient Education  2018 Elsevier Inc.     Agustina Caroli, MD [facility]

## 2019-10-05 ENCOUNTER — Encounter: Payer: Self-pay | Admitting: Podiatry

## 2019-10-05 ENCOUNTER — Ambulatory Visit: Payer: 59 | Admitting: Podiatry

## 2019-10-05 ENCOUNTER — Ambulatory Visit (INDEPENDENT_AMBULATORY_CARE_PROVIDER_SITE_OTHER): Payer: No Typology Code available for payment source

## 2019-10-05 ENCOUNTER — Other Ambulatory Visit: Payer: Self-pay

## 2019-10-05 DIAGNOSIS — L923 Foreign body granuloma of the skin and subcutaneous tissue: Secondary | ICD-10-CM

## 2019-10-05 DIAGNOSIS — M795 Residual foreign body in soft tissue: Secondary | ICD-10-CM | POA: Diagnosis not present

## 2019-10-05 NOTE — Progress Notes (Signed)
   HPI: 54 y.o. female presenting today as a new patient for evaluation of an injury that occurred to the patient's right foot approximately 2 weeks ago.  Patient states she was in a grocery store parking lot when she sustained an injury and a piece of wood stuck into her right foot.  She had bleeding and was able to remove most of the wood from the foot however she does believe she has some residual foreign splinter in the right forefoot.  Patient also states that she did stub her left fifth toe approximately 3-4 weeks ago and has been painful ever since.  She would like it evaluated as well.  She has been icing it but it is aggravated with walking in shoes.  Past Medical History:  Diagnosis Date  . Anemia   . Carotid bruit    carotid dopplers 12/22/2010, no hemodynamically significant stenosis  . GERD (gastroesophageal reflux disease)    occasional-tums prn-overindulgence  . Heart murmur    echo 10/11/2007 EF >55%mild, MR  . Hx of chest pain    neg. lexiscan myoview 02/23/2011  . Hyperlipidemia    treated by diet     Physical Exam: General: The patient is alert and oriented x3 in no acute distress.  Dermatology: Skin is warm, dry and supple bilateral lower extremities. Negative for open lesions or macerations.  There is a small foreign body wooden splinter noted between the first and second digits of the right foot.  No erythema or purulence.  No's clinical evidence of infection.  Vascular: Palpable pedal pulses bilaterally. No edema or erythema noted. Capillary refill within normal limits.  Neurological: Epicritic and protective threshold grossly intact bilaterally.   Musculoskeletal Exam: Range of motion within normal limits to all pedal and ankle joints bilateral. Muscle strength 5/5 in all groups bilateral.  There is some pain on palpation and tenderness to the left fifth digit where the injury occurred.  The toe is in good alignment and there is no ecchymosis or  edema.  Radiographic Exam:  Normal osseous mineralization. Joint spaces preserved. No fracture/dislocation/boney destruction.  No evidence of fracture to the left fifth digit  Assessment: 1.  Stubbing injury left fifth toe 2.  Foreign body wooden splinter right forefoot   Plan of Care:  1. Patient evaluated. X-Rays reviewed.  2.  Taping was applied to the left fifth digit and it was buddy taped to the adjacent toe. 3.  Injection of 1 cc of 2% lidocaine was infiltrated around the foreign body splinter.  The splinter was removed in toto and antibiotic ointment and a Band-Aid was applied. 4.  Silvadene cream provided for the patient.  Apply daily with a Band-Aid x1 week 5.  Return to clinic as needed      Edrick Kins, DPM [facility]  Dr. Edrick Kins, DPM    [address], [place] [address]                Office [phone]  Fax [phone]

## 2019-10-09 ENCOUNTER — Telehealth: Payer: Self-pay | Admitting: Podiatry

## 2019-10-09 NOTE — Telephone Encounter (Signed)
I spoke with patient and informed her that she can stop by office and pick up a post op shoe tomorrow.  She requested to pick it up in [place] office.

## 2019-10-09 NOTE — Telephone Encounter (Signed)
Pt was seen in office last week for foreign object in her foot and states she has been in severe pain since the visit. Pt would like to get a boot to help her with walking and wanted to know if she could come by the office to pick it up.   Please give patient a call.

## 2019-12-26 ENCOUNTER — Other Ambulatory Visit: Payer: Self-pay

## 2019-12-26 ENCOUNTER — Other Ambulatory Visit (HOSPITAL_COMMUNITY)
Admission: RE | Admit: 2019-12-26 | Discharge: 2019-12-26 | Disposition: A | Discharge status: Home / Self Care | Payer: No Typology Code available for payment source | Source: Ambulatory Visit | Attending: Nurse Practitioner | Admitting: Nurse Practitioner

## 2019-12-26 ENCOUNTER — Ambulatory Visit (INDEPENDENT_AMBULATORY_CARE_PROVIDER_SITE_OTHER): Payer: No Typology Code available for payment source | Admitting: Nurse Practitioner

## 2019-12-26 ENCOUNTER — Encounter: Payer: Self-pay | Admitting: Nurse Practitioner

## 2019-12-26 VITALS — BP 120/78 | HR 70 | Temp 98.5°F | Ht 67.2 in | Wt 200.6 lb

## 2019-12-26 DIAGNOSIS — Z113 Encounter for screening for infections with a predominantly sexual mode of transmission: Secondary | ICD-10-CM | POA: Diagnosis not present

## 2019-12-26 DIAGNOSIS — Z1211 Encounter for screening for malignant neoplasm of colon: Secondary | ICD-10-CM | POA: Diagnosis not present

## 2019-12-26 DIAGNOSIS — E7800 Pure hypercholesterolemia, unspecified: Secondary | ICD-10-CM

## 2019-12-26 DIAGNOSIS — Z1159 Encounter for screening for other viral diseases: Secondary | ICD-10-CM | POA: Diagnosis not present

## 2019-12-26 DIAGNOSIS — Z124 Encounter for screening for malignant neoplasm of cervix: Secondary | ICD-10-CM

## 2019-12-26 DIAGNOSIS — Z0000 Encounter for general adult medical examination without abnormal findings: Secondary | ICD-10-CM | POA: Diagnosis not present

## 2019-12-26 DIAGNOSIS — Z1231 Encounter for screening mammogram for malignant neoplasm of breast: Secondary | ICD-10-CM

## 2019-12-26 NOTE — Progress Notes (Signed)
I,Yamilka Roman [employer] as a Education administrator for [employer], FNP.,have documented all relevant documentation on the behalf of Minette Brine, FNP,as directed by  Minette Brine, FNP while in the presence of Minette Brine, [place]. This visit occurred during the SARS-CoV-2 public health emergency.  Safety protocols were in place, including screening questions prior to the visit, additional usage of staff PPE, and extensive cleaning of exam room while observing appropriate contact time as indicated for disinfecting solutions.  Subjective:     Patient ID: Wendy Pineda , female    DOB: 11-Oct-1965 , 54 y.o.   MRN: [id number]   Chief Complaint  Patient presents with  . Annual Exam    HPI  Here for HM. She is recently divorced.    Wt Readings from Last 3 Encounters: 12/26/19 : 200 lb 9.6 oz (91 kg) 03/14/18 : 199 lb (90.3 kg) 07/12/17 : 183 lb (83 kg)       Past Medical History:  Diagnosis Date  . Anemia   . Carotid bruit    carotid dopplers 12/22/2010, no hemodynamically significant stenosis  . GERD (gastroesophageal reflux disease)    occasional-tums prn-overindulgence  . Heart murmur    echo 10/11/2007 EF >55%mild, MR  . Hx of chest pain    neg. lexiscan myoview 02/23/2011  . Hyperlipidemia    treated by diet     Family History  Problem Relation Age of Onset  . Breast cancer Mother   . Stroke Maternal Grandmother   . Cancer Maternal Grandfather      Current Outpatient Medications:  .  B Complex Vitamins (B COMPLEX PO), Take 1 packet by mouth daily. Reported on 06/27/2015, Disp: , Rfl:  .  folic acid (FOLVITE) 546 MCG tablet, Take 400 mcg by mouth daily. Reported on 06/27/2015, Disp: , Rfl:  .  IRON PO, Take 1 tablet by mouth daily. Reported on 06/27/2015, Disp: , Rfl:  .  Multiple Vitamins-Minerals (ANTIOXIDANT PO), Take 1 packet by mouth daily. Reported on 06/27/2015, Disp: , Rfl:  .  VITAMIN D PO, Take 1 tablet by mouth daily., Disp: , Rfl:  .  Omega-3 Fatty Acids (FISH OIL) 1000  MG CAPS, Take by mouth. Reported on 06/27/2015 (Patient not taking: Reported on 12/26/2019), Disp: , Rfl:    Allergies  Allergen Reactions  . Sulfa Antibiotics Hives and Swelling  . Sulfur     The patient states she is post menopausal status   No breakthrough bleeding. Negative for: breast discharge, breast lump(s), breast pain and breast self exam. Associated symptoms include abnormal vaginal bleeding. Pertinent negatives include abnormal bleeding (hematology), anxiety, decreased libido, depression, difficulty falling sleep, dyspareunia, history of infertility, nocturia, sexual dysfunction, sleep disturbances, urinary incontinence, urinary urgency, vaginal discharge and vaginal itching. Diet regular, she is elimitinating white bread and sugar. Eating more healthy such as greens. The patient states her exercise level is moderate with walking - MWF for 30 minutes.  She is not currently going to the gym [employer])  The patient's tobacco use is:  Social History   Tobacco Use  Smoking Status Never Smoker  Smokeless Tobacco Never Used   She has been exposed to passive smoke. The patient's alcohol use is:  Social History   Substance and Sexual Activity  Alcohol Use No   Additional information: she had been seeing [facility].  She had a myomectomy in the past.  Last pap she thinks 2018.    Review of Systems  Constitutional: Negative.   HENT: Negative.   Eyes: Negative.   Respiratory: Negative.   Cardiovascular: Negative.   Gastrointestinal: Negative.   Endocrine: Negative.   Genitourinary: Negative.   Musculoskeletal: Negative.   Skin: Negative.   Allergic/Immunologic: Negative.   Neurological: Negative.   Hematological: Negative.   Psychiatric/Behavioral: Negative.      Today's Vitals   12/26/19 1520  BP: 120/78  Pulse: 70  Temp: 98.5 F (36.9 C)  TempSrc: Oral  Weight: 200 lb 9.6 oz (91 kg)  Height: 5' 7.2" (1.707 m)  PainSc:  0-No pain   Body mass index is 31.23 kg/m.   Objective:  Physical Exam Constitutional:      General: She is not in acute distress.    Appearance: Normal appearance. She is well-developed. She is obese.  HENT:     Head: Normocephalic and atraumatic.     Right Ear: Hearing, tympanic membrane, ear canal and external ear normal. There is no impacted cerumen.     Left Ear: Hearing, tympanic membrane, ear canal and external ear normal. There is no impacted cerumen.     Nose:     Comments: Deferred - masked    Mouth/Throat:     Comments: Deferred - masked Eyes:     General: Lids are normal.     Extraocular Movements: Extraocular movements intact.     Conjunctiva/sclera: Conjunctivae normal.     Pupils: Pupils are equal, round, and reactive to light.     Funduscopic exam:    Right eye: No papilledema.        Left eye: No papilledema.  Neck:     Thyroid: No thyroid mass.     Vascular: No carotid bruit.  Cardiovascular:     Rate and Rhythm: Normal rate and regular rhythm.     Pulses: Normal pulses.     Heart sounds: Normal heart sounds. No murmur heard.   Pulmonary:     Effort: Pulmonary effort is normal. No respiratory distress.     Breath sounds: Normal breath sounds. No wheezing.  Chest:     Chest wall: No mass.     Breasts: Tanner Score is 5.        Right: Normal. No swelling, mass or tenderness.        Left: Normal. No swelling, mass or tenderness.  Abdominal:     General: Abdomen is flat. Bowel sounds are normal. There is no distension.     Palpations: Abdomen is soft.     Tenderness: There is no abdominal tenderness.  Genitourinary:    Labia:        Right: No rash or tenderness.        Left: No rash or tenderness.      Vagina: Normal.     Cervix: Normal.     Uterus: Normal.      Adnexa: Right adnexa normal and left adnexa normal.       Right: No mass or tenderness.         Left: No mass or tenderness.       Rectum: Normal.  Musculoskeletal:        General: No  swelling. Normal range of motion.     Cervical back: Full passive range of motion without pain, normal range of motion and neck supple.     Right lower leg: No edema.     Left lower leg: No edema.  Lymphadenopathy:     Upper Body:     Right upper body: No  supraclavicular or axillary adenopathy.     Left upper body: No supraclavicular or axillary adenopathy.  Skin:    General: Skin is warm and dry.     Capillary Refill: Capillary refill takes less than 2 seconds.  Neurological:     General: No focal deficit present.     Mental Status: She is alert and oriented to person, place, and time.     Cranial Nerves: No cranial nerve deficit.     Sensory: No sensory deficit.  Psychiatric:        Mood and Affect: Mood normal.        Behavior: Behavior normal.        Thought Content: Thought content normal.        Judgment: Judgment normal.         Assessment And Plan:     1. Encounter for general adult medical examination w/o abnormal findings . Behavior modifications discussed and diet history reviewed.   . Pt will continue to exercise regularly and modify diet with low GI, plant based foods and decrease intake of processed foods.  . Recommend intake of daily multivitamin, Vitamin D, and calcium.  . Recommend mammogram and colonoscopy for preventive screenings, as well as recommend immunizations that include influenza, TDAP - CMP14+EGFR - CBC   2. Encounter for screening colonoscopy  According to USPTF Colorectal cancer Screening guidelines. Colonoscopy is recommended every 10 years, starting at age 38years.  Will refer to GI for colon cancer screening. - Ambulatory referral to Gastroenterology  3. Encounter for screening mammogram for malignant neoplasm of breast  Pt instructed on Self Breast Exam.According to ACOG guidelines Women aged 54 and older are recommended to get an annual [place] will contact for appointment scheduling.   Pt encouraged to get annual  mammogram - MM DIGITAL SCREENING BILATERAL; Future  4. Elevated cholesterol  Chronic, she is not currently on any prescription medications  Will check Lipid panel  5. Encounter for hepatitis C screening test for low risk patient  Will check Hepatitis C screening due to recent recommendations to screen all adults 18 years and older - Hepatitis C antibody  6. Encounter for Papanicolaou smear of cervix  PAP done no abnormal findings on physical exam  7. Screening for STD (sexually transmitted disease) - HIV Antibody (routine testing w rflx) - T pallidum Screening [place] - Cytology - PAP     Patient was given opportunity to ask questions. Patient verbalized understanding of the plan and was able to repeat key elements of the plan. All questions were answered to their satisfaction.    Teola Bradley, FNP, have reviewed all documentation for this visit. The documentation on 01/16/20 for the exam, diagnosis, procedures, and orders are all accurate and complete.  THE PATIENT IS ENCOURAGED TO PRACTICE SOCIAL DISTANCING DUE TO THE COVID-19 PANDEMIC.

## 2019-12-26 NOTE — Patient Instructions (Signed)
Health Maintenance, Female Adopting a healthy lifestyle and getting preventive care are important in promoting health and wellness. Ask your health care provider about:  The right schedule for you to have regular tests and exams.  Things you can do on your own to prevent diseases and keep yourself healthy. What should I know about diet, weight, and exercise? Eat a healthy diet   Eat a diet that includes plenty of vegetables, fruits, low-fat dairy products, and lean protein.  Do not eat a lot of foods that are high in solid fats, added sugars, or sodium. Maintain a healthy weight Body mass index (BMI) is used to identify weight problems. It estimates body fat based on height and weight. Your health care provider can help determine your BMI and help you achieve or maintain a healthy weight. Get regular exercise Get regular exercise. This is one of the most important things you can do for your health. Most adults should:  Exercise for at least 150 minutes each week. The exercise should increase your heart rate and make you sweat (moderate-intensity exercise).  Do strengthening exercises at least twice a week. This is in addition to the moderate-intensity exercise.  Spend less time sitting. Even light physical activity can be beneficial. Watch cholesterol and blood lipids Have your blood tested for lipids and cholesterol at 52 years of age, then have this test every 5 years. Have your cholesterol levels checked more often if:  Your lipid or cholesterol levels are high.  You are older than 52 years of age.  You are at high risk for heart disease. What should I know about cancer screening? Depending on your health history and family history, you may need to have cancer screening at various ages. This may include screening for:  Breast cancer.  Cervical cancer.  Colorectal cancer.  Skin cancer.  Lung cancer. What should I know about heart disease, diabetes, and high blood  pressure? Blood pressure and heart disease  High blood pressure causes heart disease and increases the risk of stroke. This is more likely to develop in people who have high blood pressure readings, are of African descent, or are overweight.  Have your blood pressure checked: ? Every 3-5 years if you are 18-39 years of age. ? Every year if you are 40 years old or older. Diabetes Have regular diabetes screenings. This checks your fasting blood sugar level. Have the screening done:  Once every three years after age 40 if you are at a normal weight and have a low risk for diabetes.  More often and at a younger age if you are overweight or have a high risk for diabetes. What should I know about preventing infection? Hepatitis B If you have a higher risk for hepatitis B, you should be screened for this virus. Talk with your health care provider to find out if you are at risk for hepatitis B infection. Hepatitis C Testing is recommended for:  Everyone born from 1945 through 1965.  Anyone with known risk factors for hepatitis C. Sexually transmitted infections (STIs)  Get screened for STIs, including gonorrhea and chlamydia, if: ? You are sexually active and are younger than 52 years of age. ? You are older than 52 years of age and your health care provider tells you that you are at risk for this type of infection. ? Your sexual activity has changed since you were last screened, and you are at increased risk for chlamydia or gonorrhea. Ask your health care provider if   you are at risk.  Ask your health care provider about whether you are at high risk for HIV. Your health care provider may recommend a prescription medicine to help prevent HIV infection. If you choose to take medicine to prevent HIV, you should first get tested for HIV. You should then be tested every 3 months for as long as you are taking the medicine. Pregnancy  If you are about to stop having your period (premenopausal) and  you may become pregnant, seek counseling before you get pregnant.  Take 400 to 800 micrograms (mcg) of folic acid every day if you become pregnant.  Ask for birth control (contraception) if you want to prevent pregnancy. Osteoporosis and menopause Osteoporosis is a disease in which the bones lose minerals and strength with aging. This can result in bone fractures. If you are 65 years old or older, or if you are at risk for osteoporosis and fractures, ask your health care provider if you should:  Be screened for bone loss.  Take a calcium or vitamin D supplement to lower your risk of fractures.  Be given hormone replacement therapy (HRT) to treat symptoms of menopause. Follow these instructions at home: Lifestyle  Do not use any products that contain nicotine or tobacco, such as cigarettes, e-cigarettes, and chewing tobacco. If you need help quitting, ask your health care provider.  Do not use street drugs.  Do not share needles.  Ask your health care provider for help if you need support or information about quitting drugs. Alcohol use  Do not drink alcohol if: ? Your health care provider tells you not to drink. ? You are pregnant, may be pregnant, or are planning to become pregnant.  If you drink alcohol: ? Limit how much you use to 0-1 drink a day. ? Limit intake if you are breastfeeding.  Be aware of how much alcohol is in your drink. In the [place] one drink equals one 12 oz bottle of beer (355 mL), one 5 oz glass of wine (148 mL), or one 1 oz glass of hard liquor (44 mL). General instructions  Schedule regular health, dental, and eye exams.  Stay current with your vaccines.  Tell your health care provider if: ? You often feel depressed. ? You have ever been abused or do not feel safe at home. Summary  Adopting a healthy lifestyle and getting preventive care are important in promoting health and wellness.  Follow your health care provider's instructions about healthy  diet, exercising, and getting tested or screened for diseases.  Follow your health care provider's instructions on monitoring your cholesterol and blood pressure. This information is not intended to replace advice given to you by your health care provider. Make sure you discuss any questions you have with your health care provider. Document Revised: 03/15/2018 Document Reviewed: 03/15/2018 Elsevier Patient Education  2020 Elsevier Inc.  

## 2019-12-27 LAB — HIV ANTIBODY (ROUTINE TESTING W REFLEX): HIV Screen 4th Generation wRfx: NONREACTIVE

## 2019-12-27 LAB — LIPID PANEL
Chol/HDL Ratio: 3.3 ratio (ref 0.0–4.4)
Cholesterol, Total: 242 mg/dL — ABNORMAL HIGH (ref 100–199)
HDL: 73 mg/dL (ref >39)
LDL Chol Calc (NIH): 154 mg/dL — ABNORMAL HIGH (ref 0–99)
Triglycerides: 90 mg/dL (ref 0–149)
VLDL Cholesterol Cal: 15 mg/dL (ref 5–40)

## 2019-12-27 LAB — CMP14+EGFR
ALT: 24 IU/L (ref 0–32)
AST: 15 IU/L (ref 0–40)
Albumin/Globulin Ratio: 1.5 (ref 1.2–2.2)
Albumin: 4.3 g/dL (ref 3.8–4.9)
Alkaline Phosphatase: 149 IU/L — ABNORMAL HIGH (ref 44–121)
BUN/Creatinine Ratio: 11 (ref 9–23)
BUN: 7 mg/dL (ref 6–24)
Bilirubin Total: 0.3 mg/dL (ref 0.0–1.2)
CO2: 26 mmol/L (ref 20–29)
Calcium: 9.5 mg/dL (ref 8.7–10.2)
Chloride: 102 mmol/L (ref 96–106)
Creatinine, Ser: 0.65 mg/dL (ref 0.57–1.00)
GFR calc Af Amer: 118 mL/min/{1.73_m2} (ref >59)
GFR calc non Af Amer: 102 mL/min/{1.73_m2} (ref >59)
Globulin, Total: 2.9 g/dL (ref 1.5–4.5)
Glucose: 84 mg/dL (ref 65–99)
Potassium: 3.8 mmol/L (ref 3.5–5.2)
Sodium: 141 mmol/L (ref 134–144)
Total Protein: 7.2 g/dL (ref 6.0–8.5)

## 2019-12-27 LAB — CBC
Hematocrit: 40.4 % (ref 34.0–46.6)
Hemoglobin: 13.6 g/dL (ref 11.1–15.9)
MCH: 26.7 pg (ref 26.6–33.0)
MCHC: 33.7 g/dL (ref 31.5–35.7)
MCV: 79 fL (ref 79–97)
Platelets: 309 10*3/uL (ref 150–450)
RBC: 5.09 x10E6/uL (ref 3.77–5.28)
RDW: 12.9 % (ref 11.7–15.4)
WBC: 6 10*3/uL (ref 3.4–10.8)

## 2019-12-27 LAB — HEPATITIS C ANTIBODY: Hep C Virus Ab: <0.1 s/co ratio (ref 0.0–0.9)

## 2019-12-27 LAB — T PALLIDUM SCREENING CASCADE: T pallidum Antibodies (TP-PA): NONREACTIVE

## 2020-01-02 LAB — CYTOLOGY - PAP
Chlamydia: NEGATIVE
Comment: NEGATIVE
Comment: NEGATIVE
Comment: NEGATIVE
Comment: NEGATIVE
Comment: NORMAL
Diagnosis: NEGATIVE
HSV1: NEGATIVE
HSV2: NEGATIVE
High risk HPV: NEGATIVE
Neisseria Gonorrhea: NEGATIVE
Trichomonas: NEGATIVE

## 2020-01-16 ENCOUNTER — Encounter: Payer: Self-pay | Admitting: Nurse Practitioner

## 2020-03-26 ENCOUNTER — Ambulatory Visit: Payer: Self-pay | Admitting: Nurse Practitioner

## 2020-04-09 ENCOUNTER — Telehealth: Payer: No Typology Code available for payment source | Admitting: Nurse Practitioner

## 2020-04-09 ENCOUNTER — Telehealth: Payer: Self-pay

## 2020-04-09 NOTE — Telephone Encounter (Signed)
Patient called wanted appointment explained to the patient due to her covid related symptoms the visit would need to be a virtual visit patient refused to set up mychart account explained to the patient that is only way to do a virtual visit she still refused and states she will go through a drive thru to be tested for covid

## 2020-07-03 ENCOUNTER — Encounter: Payer: Self-pay | Admitting: Nurse Practitioner

## 2020-07-03 ENCOUNTER — Ambulatory Visit (INDEPENDENT_AMBULATORY_CARE_PROVIDER_SITE_OTHER): Payer: No Typology Code available for payment source | Admitting: Nurse Practitioner

## 2020-07-03 ENCOUNTER — Other Ambulatory Visit: Payer: Self-pay

## 2020-07-03 VITALS — BP 126/84 | HR 68 | Temp 98.1°F | Ht 68.8 in | Wt 191.2 lb

## 2020-07-03 DIAGNOSIS — E7800 Pure hypercholesterolemia, unspecified: Secondary | ICD-10-CM | POA: Diagnosis not present

## 2020-07-03 DIAGNOSIS — N921 Excessive and frequent menstruation with irregular cycle: Secondary | ICD-10-CM | POA: Diagnosis not present

## 2020-07-03 DIAGNOSIS — Z1231 Encounter for screening mammogram for malignant neoplasm of breast: Secondary | ICD-10-CM | POA: Diagnosis not present

## 2020-07-03 DIAGNOSIS — Z1211 Encounter for screening for malignant neoplasm of colon: Secondary | ICD-10-CM | POA: Diagnosis not present

## 2020-07-03 NOTE — Progress Notes (Deleted)
This visit occurred during the SARS-CoV-2 public health emergency.  Safety protocols were in place, including screening questions prior to the visit, additional usage of staff PPE, and extensive cleaning of exam room while observing appropriate contact time as indicated for disinfecting solutions.  Subjective:     Patient ID: Wendy Pineda , female    DOB: 01-Jan-1966 , 55 y.o.   MRN: [id number]   Chief Complaint  Patient presents with  . Vaginal Bleeding    HPI  Wendy Pineda presents with postmenopausal bleeding.  She initially had vaginal bleeding in 01/2021 that was enough to fill a panty liner and was dark red.  Shortly after, she experienced a sharp pain in her left groin that radiated to the abdomen.  She had a second episode of vaginal bleeding 1-2 weeks ago.  There was brown blood when wiping.  She reports her last menstrual cycle was 2-3 years ago.  She denies abdominal pain and cramping.  She is not on hormone replacement.  She has a history of fibroids.  She is not sexually active.  Vaginal Bleeding     Past Medical History:  Diagnosis Date  . Anemia   . Carotid bruit    carotid dopplers 12/22/2010, no hemodynamically significant stenosis  . GERD (gastroesophageal reflux disease)    occasional-tums prn-overindulgence  . Heart murmur    echo 10/11/2007 EF >55%mild, MR  . Hx of chest pain    neg. lexiscan myoview 02/23/2011  . Hyperlipidemia    treated by diet     Family History  Problem Relation Age of Onset  . Breast cancer Mother   . Stroke Maternal Grandmother   . Cancer Maternal Grandfather      Current Outpatient Medications:  .  Ascorbic Acid (VITAMIN C PO), Take 1 tablet by mouth daily at 12 noon., Disp: , Rfl:  .  B Complex Vitamins (B COMPLEX PO), Take 1 packet by mouth daily. Reported on 06/27/2015, Disp: , Rfl:  .  folic acid (FOLVITE) 122 MCG tablet, Take 400 mcg by mouth daily. Reported on 06/27/2015, Disp: , Rfl:  .  Multiple Vitamins-Minerals (ANTIOXIDANT  PO), Take 1 packet by mouth daily. Reported on 06/27/2015, Disp: , Rfl:  .  Multiple Vitamins-Minerals (ZINC PO), Take 1 tablet by mouth daily at 12 noon., Disp: , Rfl:  .  Omega-3 Fatty Acids (FISH OIL) 1000 MG CAPS, Take by mouth. Reported on 06/27/2015, Disp: , Rfl:  .  VITAMIN D PO, Take 1 tablet by mouth daily., Disp: , Rfl:    Allergies  Allergen Reactions  . Elemental Sulfur   . Sulfa Antibiotics Hives and Swelling     Review of Systems  Constitutional: Negative.   Genitourinary: Positive for vaginal bleeding.     Today's Vitals   07/03/20 0932  BP: 126/84  Pulse: 68  Temp: 98.1 F (36.7 C)  TempSrc: Oral  Weight: 191 lb 3.2 oz (86.7 kg)  Height: 5' 8.8" (1.748 m)  PainSc: 0-No pain   Body mass index is 28.4 kg/m.   Objective:  Physical Exam      Assessment And Plan:     1. Breakthrough bleeding     Patient was given opportunity to ask questions. Patient verbalized understanding of the plan and was able to repeat key elements of the plan. All questions were answered to their satisfaction.  Anastasio Auerbach, RN   I, Anastasio Auerbach, RN, have reviewed all documentation for this visit. The documentation on 07/03/20 for  the exam, diagnosis, procedures, and orders are all accurate and complete.   IF YOU HAVE BEEN REFERRED TO A SPECIALIST, IT MAY TAKE 1-2 WEEKS TO SCHEDULE/PROCESS THE REFERRAL. IF YOU HAVE NOT HEARD FROM US/SPECIALIST IN TWO WEEKS, PLEASE GIVE [place] A CALL AT [phone].   THE PATIENT IS ENCOURAGED TO PRACTICE SOCIAL DISTANCING DUE TO THE COVID-19 PANDEMIC.

## 2020-07-03 NOTE — Progress Notes (Signed)
I,Yamilka Roman [employer] as a Education administrator for [employer], FNP.,have documented all relevant documentation on the behalf of Minette Brine, FNP,as directed by  Minette Brine, FNP while in the presence of Minette Brine, [place]. This visit occurred during the SARS-CoV-2 public health emergency.  Safety protocols were in place, including screening questions prior to the visit, additional usage of staff PPE, and extensive cleaning of exam room while observing appropriate contact time as indicated for disinfecting solutions.  Subjective:     Patient ID: Wendy Pineda , female    DOB: Sep 18, 1965 , 55 y.o.   MRN: [id number]   Chief Complaint  Patient presents with  . Vaginal Bleeding    HPI  Patient presents today for breakthough bleeding. She stated she noticed some blood on the tissue and her underwear for 2 days. She stated she is no longer bleeding.  She originally had vaginal bleeding in October after her physical last year. LMP - 2019 or 2020. It was recommended to have a complete hysterectomy due to fibroids by Dr. Raphael Gibney. When her menstrual cycle stopped she had irregular bleeding.   Wt Readings from Last 3 Encounters: 07/03/20 : 191 lb 3.2 oz (86.7 kg) 12/26/19 : 200 lb 9.6 oz (91 kg) 03/14/18 : 199 lb (90.3 kg)   Vaginal Bleeding The patient's primary symptoms include vaginal bleeding (after wiping). This is a new problem. The current episode started more than 1 month ago. The problem occurs intermittently. The problem has been unchanged. She is not pregnant. Pertinent negatives include no abdominal pain, chills, constipation, flank pain, frequency, headaches, hematuria or vomiting. The vaginal bleeding is spotting. She has not been passing clots. She has not been passing tissue. Nothing aggravates the symptoms. She is not sexually active.     Past Medical History:  Diagnosis Date  . Anemia   . Carotid bruit    carotid dopplers 12/22/2010, no hemodynamically significant stenosis  . GERD  (gastroesophageal reflux disease)    occasional-tums prn-overindulgence  . Heart murmur    echo 10/11/2007 EF >55%mild, MR  . Hx of chest pain    neg. lexiscan myoview 02/23/2011  . Hyperlipidemia    treated by diet     Family History  Problem Relation Age of Onset  . Breast cancer Mother   . Stroke Maternal Grandmother   . Cancer Maternal Grandfather      Current Outpatient Medications:  .  Ascorbic Acid (VITAMIN C PO), Take 1 tablet by mouth daily at 12 noon., Disp: , Rfl:  .  B Complex Vitamins (B COMPLEX PO), Take 1 packet by mouth daily. Reported on 06/27/2015, Disp: , Rfl:  .  folic acid (FOLVITE) 409 MCG tablet, Take 400 mcg by mouth daily. Reported on 06/27/2015, Disp: , Rfl:  .  Multiple Vitamins-Minerals (ANTIOXIDANT PO), Take 1 packet by mouth daily. Reported on 06/27/2015, Disp: , Rfl:  .  Multiple Vitamins-Minerals (ZINC PO), Take 1 tablet by mouth daily at 12 noon., Disp: , Rfl:  .  Omega-3 Fatty Acids (FISH OIL) 1000 MG CAPS, Take by mouth. Reported on 06/27/2015, Disp: , Rfl:  .  VITAMIN D PO, Take 1 tablet by mouth daily., Disp: , Rfl:    Allergies  Allergen Reactions  . Elemental Sulfur   . Sulfa Antibiotics Hives and Swelling     Review of Systems  Constitutional: Negative for chills.  Respiratory: Negative.   Cardiovascular: Negative.  Negative for chest pain, palpitations and leg swelling.  Gastrointestinal: Negative for abdominal pain, constipation  and vomiting.  Genitourinary: Positive for vaginal bleeding. Negative for flank pain, frequency and hematuria.  Neurological: Negative for dizziness and headaches.     Today's Vitals   07/03/20 0932  BP: 126/84  Pulse: 68  Temp: 98.1 F (36.7 C)  TempSrc: Oral  Weight: 191 lb 3.2 oz (86.7 kg)  Height: 5' 8.8" (1.748 m)  PainSc: 0-No pain   Body mass index is 28.4 kg/m.   Objective:  Physical Exam Vitals reviewed.  Constitutional:      General: She is not in acute distress.    Appearance: Normal  appearance.  Cardiovascular:     Rate and Rhythm: Normal rate and regular rhythm.  Neurological:     Mental Status: She is alert.         Assessment And Plan:     1. Breakthrough bleeding  She is having breakthrough bleeding will refer to GYN for further evaluation  Denies pain associated - Ambulatory referral to Obstetrics / Gynecology  2. Elevated cholesterol  Chronic, controlled  Continue with current medications - Lipid panel - BMP8+eGFR  3. Screening mammogram, encounter for  Pt instructed on Self Breast Exam.According to ACOG guidelines Women aged 44 and older are recommended to get an annual mammogram. Form completed and given to patient contact the [facility] for appointment scheduing.   Pt encouraged to get annual mammogram - MM Digital Screening; Future  4. Encounter for screening colonoscopy  According to USPTF Colorectal cancer Screening guidelines. Colonoscopy is recommended every 10 years, starting at age 16 years.  Will refer to GI for colon cancer screening. - Ambulatory referral to Gastroenterology     Patient was given opportunity to ask questions. Patient verbalized understanding of the plan and was able to repeat key elements of the plan. All questions were answered to their satisfaction.  Minette Brine, FNP   I, Minette Brine, FNP, have reviewed all documentation for this visit. The documentation on 07/03/20 for the exam, diagnosis, procedures, and orders are all accurate and complete.   IF YOU HAVE BEEN REFERRED TO A SPECIALIST, IT MAY TAKE 1-2 WEEKS TO SCHEDULE/PROCESS THE REFERRAL. IF YOU HAVE NOT HEARD FROM US/SPECIALIST IN TWO WEEKS, PLEASE GIVE [place] A CALL AT [phone].   THE PATIENT IS ENCOURAGED TO PRACTICE SOCIAL DISTANCING DUE TO THE COVID-19 PANDEMIC.

## 2020-07-17 ENCOUNTER — Other Ambulatory Visit: Payer: Self-pay

## 2020-07-17 ENCOUNTER — Other Ambulatory Visit: Payer: No Typology Code available for payment source

## 2020-07-18 LAB — LIPID PANEL
Chol/HDL Ratio: 3.4 ratio (ref 0.0–4.4)
Cholesterol, Total: 262 mg/dL — ABNORMAL HIGH (ref 100–199)
HDL: 78 mg/dL (ref >39)
LDL Chol Calc (NIH): 174 mg/dL — ABNORMAL HIGH (ref 0–99)
Triglycerides: 65 mg/dL (ref 0–149)
VLDL Cholesterol Cal: 10 mg/dL (ref 5–40)

## 2020-07-18 LAB — BMP8+EGFR
BUN/Creatinine Ratio: 12 (ref 9–23)
BUN: 9 mg/dL (ref 6–24)
CO2: 23 mmol/L (ref 20–29)
Calcium: 9.5 mg/dL (ref 8.7–10.2)
Chloride: 104 mmol/L (ref 96–106)
Creatinine, Ser: 0.77 mg/dL (ref 0.57–1.00)
Glucose: 92 mg/dL (ref 65–99)
Potassium: 4.3 mmol/L (ref 3.5–5.2)
Sodium: 142 mmol/L (ref 134–144)
eGFR: 93 mL/min/{1.73_m2} (ref >59)

## 2020-09-18 ENCOUNTER — Encounter: Payer: Self-pay | Admitting: Nurse Practitioner

## 2020-09-18 ENCOUNTER — Ambulatory Visit (INDEPENDENT_AMBULATORY_CARE_PROVIDER_SITE_OTHER): Payer: No Typology Code available for payment source | Admitting: Nurse Practitioner

## 2020-09-18 ENCOUNTER — Other Ambulatory Visit: Payer: Self-pay

## 2020-09-18 ENCOUNTER — Encounter: Payer: Self-pay | Admitting: Gastroenterology

## 2020-09-18 VITALS — BP 134/86 | HR 65 | Temp 98.1°F | Ht 68.8 in | Wt 191.8 lb

## 2020-09-18 DIAGNOSIS — F32A Depression, unspecified: Secondary | ICD-10-CM

## 2020-09-18 DIAGNOSIS — F439 Reaction to severe stress, unspecified: Secondary | ICD-10-CM | POA: Diagnosis not present

## 2020-09-18 DIAGNOSIS — F419 Anxiety disorder, unspecified: Secondary | ICD-10-CM

## 2020-09-18 MED ORDER — ESCITALOPRAM OXALATE 10 MG PO TABS: 10.0000 mg | ORAL_TABLET | Freq: Every day | ORAL | 2 refills | Status: DC

## 2020-09-18 NOTE — Patient Instructions (Signed)
Insomnia Insomnia is a sleep disorder that makes it difficult to fall asleep or stay asleep. Insomnia can cause fatigue, low energy, difficulty concentrating, moodswings, and poor performance at work or school. There are three different ways to classify insomnia: Difficulty falling asleep. Difficulty staying asleep. Waking up too early in the morning. Any type of insomnia can be long-term (chronic) or short-term (acute). Both are common. Short-term insomnia usually lasts for three months or less. Chronic insomnia occurs at least three times a week for longer than threemonths. What are the causes? Insomnia may be caused by another condition, situation, or substance, such as: Anxiety. Certain medicines. Gastroesophageal reflux disease (GERD) or other gastrointestinal conditions. Asthma or other breathing conditions. Restless legs syndrome, sleep apnea, or other sleep disorders. Chronic pain. Menopause. Stroke. Abuse of alcohol, tobacco, or illegal drugs. Mental health conditions, such as depression. Caffeine. Neurological disorders, such as Alzheimer's disease. An overactive thyroid (hyperthyroidism). Sometimes, the cause of insomnia may not be known. What increases the risk? Risk factors for insomnia include: Gender. Women are affected more often than men. Age. Insomnia is more common as you get older. Stress. Lack of exercise. Irregular work schedule or working night shifts. Traveling between different time zones. Certain medical and mental health conditions. What are the signs or symptoms? If you have insomnia, the main symptom is having trouble falling asleep or having trouble staying asleep. This may lead to other symptoms, such as: Feeling fatigued or having low energy. Feeling nervous about going to sleep. Not feeling rested in the morning. Having trouble concentrating. Feeling irritable, anxious, or depressed. How is this diagnosed? This condition may be diagnosed based  on: Your symptoms and medical history. Your health care provider may ask about: Your sleep habits. Any medical conditions you have. Your mental health. A physical exam. How is this treated? Treatment for insomnia depends on the cause. Treatment may focus on treating an underlying condition that is causing insomnia. Treatment may also include: Medicines to help you sleep. Counseling or therapy. Lifestyle adjustments to help you sleep better. Follow these instructions at home: Eating and drinking  Limit or avoid alcohol, caffeinated beverages, and cigarettes, especially close to bedtime. These can disrupt your sleep. Do not eat a large meal or eat spicy foods right before bedtime. This can lead to digestive discomfort that can make it hard for you to sleep.  Sleep habits  Keep a sleep diary to help you and your health care provider figure out what could be causing your insomnia. Write down: When you sleep. When you wake up during the night. How well you sleep. How rested you feel the next day. Any side effects of medicines you are taking. What you eat and drink. Make your bedroom a dark, comfortable place where it is easy to fall asleep. Put up shades or blackout curtains to block light from outside. Use a white noise machine to block noise. Keep the temperature cool. Limit screen use before bedtime. This includes: Watching TV. Using your smartphone, tablet, or computer. Stick to a routine that includes going to bed and waking up at the same times every day and night. This can help you fall asleep faster. Consider making a quiet activity, such as reading, part of your nighttime routine. Try to avoid taking naps during the day so that you sleep better at night. Get out of bed if you are still awake after 15 minutes of trying to sleep. Keep the lights down, but try reading or doing a quiet   activity. When you feel sleepy, go back to bed.  General instructions Take over-the-counter  and prescription medicines only as told by your health care provider. Exercise regularly, as told by your health care provider. Avoid exercise starting several hours before bedtime. Use relaxation techniques to manage stress. Ask your health care provider to suggest some techniques that may work well for you. These may include: Breathing exercises. Routines to release muscle tension. Visualizing peaceful scenes. Make sure that you drive carefully. Avoid driving if you feel very sleepy. Keep all follow-up visits as told by your health care provider. This is important. Contact a health care provider if: You are tired throughout the day. You have trouble in your daily routine due to sleepiness. You continue to have sleep problems, or your sleep problems get worse. Get help right away if: You have serious thoughts about hurting yourself or someone else. If you ever feel like you may hurt yourself or others, or have thoughts about taking your own life, get help right away. You can go to your nearest emergency department or call: Your local emergency services (911 in the [place] A suicide crisis helpline, such as the National Suicide Prevention Lifeline at 1-800-273-8255. This is open 24 hours a day. Summary Insomnia is a sleep disorder that makes it difficult to fall asleep or stay asleep. Insomnia can be long-term (chronic) or short-term (acute). Treatment for insomnia depends on the cause. Treatment may focus on treating an underlying condition that is causing insomnia. Keep a sleep diary to help you and your health care provider figure out what could be causing your insomnia. This information is not intended to replace advice given to you by your health care provider. Make sure you discuss any questions you have with your healthcare provider. Document Revised: 01/31/2020 Document Reviewed: 01/31/2020 Elsevier Patient Education  2022 [employer] Inc.  

## 2020-09-18 NOTE — Progress Notes (Signed)
I,Wendy Pineda [employer] as a Education administrator for Wendy [employer], NP.,have documented all relevant documentation on the behalf of Wendy [employer], NP,as directed by  Wendy Castilla, NP while in the presence of Wendy Castilla, NP.  This visit occurred during the SARS-CoV-2 public health emergency.  Safety protocols were in place, including screening questions prior to the visit, additional usage of staff PPE, and extensive cleaning of exam room while observing appropriate contact time as indicated for disinfecting solutions.  Subjective:     Patient ID: Wendy Pineda , female    DOB: 10-Oct-1965 , 55 y.o.   MRN: [id number]   Chief Complaint  Patient presents with   Insomnia   Stress    HPI  She is here because she is dealing with a lot of stress at work and personal life. She is going through divorce. She lost her job and she got another job. She works for [employer]. She is so stressed out with work. She is taking some time off from work right now to figure out what is best for her.    Insomnia Primary symptoms: sleep disturbance, difficulty falling asleep, frequent awakening, malaise/fatigue.   The current episode started one month. The onset quality is gradual. The problem occurs nightly. The problem has been gradually worsening since onset. The symptoms are aggravated by emotional upset, anxiety, family issues and work stress. Nothing relieves the symptoms. Past treatments include nothing. Duration of naps:  One to two hours.   Other This is a new problem. The current episode started more than 1 month ago. The problem occurs constantly. The problem has been gradually worsening. Associated symptoms include fatigue. Pertinent negatives include no chest pain, chills, coughing, headaches or weakness. The symptoms are aggravated by stress. She has tried nothing for the symptoms. The treatment provided no relief.    Past Medical History:  Diagnosis Date   Anemia    Carotid  bruit    carotid dopplers 12/22/2010, no hemodynamically significant stenosis   GERD (gastroesophageal reflux disease)    occasional-tums prn-overindulgence   Heart murmur    echo 10/11/2007 EF >55%mild, MR   Hx of chest pain    neg. lexiscan myoview 02/23/2011   Hyperlipidemia    treated by diet     Family History  Problem Relation Age of Onset   Breast cancer Mother    Stroke Maternal Grandmother    Cancer Maternal Grandfather      Current Outpatient Medications:    escitalopram (LEXAPRO) 10 MG tablet, Take 1 tablet (10 mg total) by mouth daily., Disp: 30 tablet, Rfl: 2   Ascorbic Acid (VITAMIN C PO), Take 1 tablet by mouth daily at 12 noon. (Patient not taking: Reported on 09/18/2020), Disp: , Rfl:    B Complex Vitamins (B COMPLEX PO), Take 1 packet by mouth daily. Reported on 06/27/2015 (Patient not taking: Reported on 09/18/2020), Disp: , Rfl:    folic acid (FOLVITE) 768 MCG tablet, Take 400 mcg by mouth daily. Reported on 06/27/2015 (Patient not taking: Reported on 09/18/2020), Disp: , Rfl:    Multiple Vitamins-Minerals (ANTIOXIDANT PO), Take 1 packet by mouth daily. Reported on 06/27/2015 (Patient not taking: Reported on 09/18/2020), Disp: , Rfl:    Multiple Vitamins-Minerals (ZINC PO), Take 1 tablet by mouth daily at 12 noon. (Patient not taking: Reported on 09/18/2020), Disp: , Rfl:    Omega-3 Fatty Acids (FISH OIL) 1000 MG CAPS, Take by mouth. Reported on 06/27/2015 (Patient not taking: Reported on 09/18/2020), Disp: , Rfl:  VITAMIN D PO, Take 1 tablet by mouth daily. (Patient not taking: Reported on 09/18/2020), Disp: , Rfl:    Allergies  Allergen Reactions   Elemental Sulfur    Sulfa Antibiotics Hives and Swelling     Review of Systems  Constitutional:  Positive for fatigue and malaise/fatigue. Negative for chills.  Respiratory:  Negative for cough, chest tightness, shortness of breath and wheezing.   Cardiovascular:  Negative for chest pain and palpitations.  Neurological:   Negative for weakness and headaches.  Psychiatric/Behavioral:  Positive for dysphoric mood and sleep disturbance. Negative for suicidal ideas. The patient has insomnia.     Today's Vitals   09/18/20 0951  BP: 134/86  Pulse: 65  Temp: 98.1 F (36.7 C)  Weight: 191 lb 12.8 oz (87 kg)  Height: 5' 8.8" (1.748 m)  PainSc: 0-No pain   Body mass index is 28.49 kg/m.   Objective:  Physical Exam Constitutional:      Appearance: Normal appearance.  HENT:     Head: Normocephalic and atraumatic.  Cardiovascular:     Rate and Rhythm: Normal rate and regular rhythm.     Pulses: Normal pulses.     Heart sounds: Normal heart sounds. No murmur heard. Pulmonary:     Effort: Pulmonary effort is normal. No respiratory distress.     Breath sounds: Normal breath sounds. No stridor.  Skin:    General: Skin is warm and dry.     Capillary Refill: Capillary refill takes less than 2 seconds.  Neurological:     Mental Status: She is alert.  Psychiatric:        Mood and Affect: Affect is tearful.        Speech: Speech normal.     Comments: Tearful         Assessment And Plan:     1. Situational stress -Patient is going through a lot of stress with personal life and work stress.  -She currently has taken some time off from work  -Have advised patient to talk to a therapist/counselor  - Ambulatory referral to Psychology  2. Anxiety and depression - escitalopram (LEXAPRO) 10 MG tablet; Take 1 tablet (10 mg total) by mouth daily.  Dispense: 30 tablet; Refill: 2  -patient will come in 4-6 weeks for follow up  -Side effects and appropriate use of all the medication(s) were discussed with the patient today. Patient advised to use the medication(s) as directed by their healthcare provider. The patient was encouraged to read, review, and understand all associated package inserts and contact our office with any questions or concerns. The patient accepts the risks of the treatment plan and had an  opportunity to ask questions.   The patient was encouraged to call or send a message through [place] for any questions or concerns.   Follow up: 4-6 weeks     Patient was given opportunity to ask questions. Patient verbalized understanding of the plan and was able to repeat key elements of the plan. All questions were answered to their satisfaction.  Wendy Kalista Laguardia, DNP   I, Wendy Pineda have reviewed all documentation for this visit. The documentation on 09/18/20 for the exam, diagnosis, procedures, and orders are all accurate and complete.    IF YOU HAVE BEEN REFERRED TO A SPECIALIST, IT MAY TAKE 1-2 WEEKS TO SCHEDULE/PROCESS THE REFERRAL. IF YOU HAVE NOT HEARD FROM US/SPECIALIST IN TWO WEEKS, PLEASE GIVE [place] A CALL AT [phone].   THE PATIENT IS ENCOURAGED TO PRACTICE SOCIAL  DISTANCING DUE TO THE COVID-19 PANDEMIC.

## 2020-10-02 ENCOUNTER — Ambulatory Visit (INDEPENDENT_AMBULATORY_CARE_PROVIDER_SITE_OTHER): Payer: No Typology Code available for payment source | Admitting: Nurse Practitioner

## 2020-10-02 ENCOUNTER — Other Ambulatory Visit: Payer: Self-pay

## 2020-10-02 ENCOUNTER — Encounter: Payer: Self-pay | Admitting: Nurse Practitioner

## 2020-10-02 VITALS — BP 124/68 | HR 65 | Temp 98.0°F | Ht 68.8 in | Wt 190.0 lb

## 2020-10-02 DIAGNOSIS — G4709 Other insomnia: Secondary | ICD-10-CM

## 2020-10-02 DIAGNOSIS — F32A Depression, unspecified: Secondary | ICD-10-CM

## 2020-10-02 DIAGNOSIS — F419 Anxiety disorder, unspecified: Secondary | ICD-10-CM

## 2020-10-02 DIAGNOSIS — R5383 Other fatigue: Secondary | ICD-10-CM | POA: Diagnosis not present

## 2020-10-02 NOTE — Progress Notes (Signed)
I,Katawbba Wiggins,acting as a Education administrator for [employer], NP.,have documented all relevant documentation on the behalf of [employer], NP,as directed by  Bary Castilla, NP while in the presence of Bary Castilla, NP.  This visit occurred during the SARS-CoV-2 public health emergency.  Safety protocols were in place, including screening questions prior to the visit, additional usage of staff PPE, and extensive cleaning of exam room while observing appropriate contact time as indicated for disinfecting solutions.  Subjective:     Patient ID: Wendy Pineda , female    DOB: 10-Mar-1966 , 55 y.o.   MRN: [id number]  No chief complaint on file.   HPI  The patient is here for a follow-up on her stress, anxiety and depression due to recent personal issues. She has been out of work. She had a referral place in to see a psych/counseling however she was wondering why it has taken a long time for them to work her in. She is taking the lexapro and it is helping her with sleep.   Insomnia Primary symptoms: sleep disturbance, difficulty falling asleep, frequent awakening, malaise/fatigue.   The current episode started one month. The onset quality is gradual. The problem occurs nightly. The problem has been gradually worsening since onset. The symptoms are aggravated by emotional upset, anxiety, family issues and work stress. Nothing relieves the symptoms. Past treatments include nothing. Duration of naps:  One to two hours.   Other This is a new problem. The current episode started more than 1 month ago. The problem occurs constantly. The problem has been gradually worsening. Associated symptoms include fatigue and headaches. Pertinent negatives include no arthralgias, chest pain, chills, coughing, myalgias, nausea or weakness. The symptoms are aggravated by stress. She has tried nothing for the symptoms. The treatment provided no relief.    Past Medical History:  Diagnosis Date   Anemia     Carotid bruit    carotid dopplers 12/22/2010, no hemodynamically significant stenosis   GERD (gastroesophageal reflux disease)    occasional-tums prn-overindulgence   Heart murmur    echo 10/11/2007 EF >55%mild, MR   Hx of chest pain    neg. lexiscan myoview 02/23/2011   Hyperlipidemia    treated by diet     Family History  Problem Relation Age of Onset   Breast cancer Mother    Stroke Maternal Grandmother    Cancer Maternal Grandfather      Current Outpatient Medications:    escitalopram (LEXAPRO) 10 MG tablet, Take 1 tablet (10 mg total) by mouth daily., Disp: 30 tablet, Rfl: 2   Ascorbic Acid (VITAMIN C PO), Take 1 tablet by mouth daily at 12 noon. (Patient not taking: No sig reported), Disp: , Rfl:    B Complex Vitamins (B COMPLEX PO), Take 1 packet by mouth daily. Reported on 06/27/2015 (Patient not taking: No sig reported), Disp: , Rfl:    folic acid (FOLVITE) 128 MCG tablet, Take 400 mcg by mouth daily. Reported on 06/27/2015 (Patient not taking: No sig reported), Disp: , Rfl:    Multiple Vitamins-Minerals (ANTIOXIDANT PO), Take 1 packet by mouth daily. Reported on 06/27/2015 (Patient not taking: No sig reported), Disp: , Rfl:    Multiple Vitamins-Minerals (ZINC PO), Take 1 tablet by mouth daily at 12 noon. (Patient not taking: No sig reported), Disp: , Rfl:    Omega-3 Fatty Acids (FISH OIL) 1000 MG CAPS, Take by mouth. Reported on 06/27/2015 (Patient not taking: No sig reported), Disp: , Rfl:    VITAMIN D PO,  Take 1 tablet by mouth daily. (Patient not taking: No sig reported), Disp: , Rfl:    Allergies  Allergen Reactions   Elemental Sulfur    Sulfa Antibiotics Hives and Swelling     Review of Systems  Constitutional:  Positive for fatigue and malaise/fatigue. Negative for chills.  Respiratory: Negative.  Negative for cough.   Cardiovascular: Negative.  Negative for chest pain.  Gastrointestinal: Negative.  Negative for diarrhea and nausea.  Musculoskeletal:  Negative for  arthralgias and myalgias.  Neurological:  Positive for headaches. Negative for weakness.  Psychiatric/Behavioral:  Positive for sleep disturbance. Negative for self-injury and suicidal ideas. The patient has insomnia.   All other systems reviewed and are negative.   Today's Vitals   10/02/20 1533  BP: 124/68  Pulse: 65  Temp: 98 F (36.7 C)  TempSrc: Oral  Weight: 190 lb (86.2 kg)  Height: 5' 8.8" (1.748 m)   Body mass index is 28.22 kg/m.  Wt Readings from Last 3 Encounters:  10/02/20 190 lb (86.2 kg)  09/18/20 191 lb 12.8 oz (87 kg)  07/03/20 191 lb 3.2 oz (86.7 kg)    BP Readings from Last 3 Encounters:  10/02/20 124/68  09/18/20 134/86  07/03/20 126/84    Objective:  Physical Exam Constitutional:      Appearance: Normal appearance.  HENT:     Head: Normocephalic and atraumatic.  Cardiovascular:     Rate and Rhythm: Normal rate and regular rhythm.     Pulses: Normal pulses.     Heart sounds: Normal heart sounds. No murmur heard. Pulmonary:     Effort: Pulmonary effort is normal. No respiratory distress.     Breath sounds: Normal breath sounds.  Skin:    General: Skin is warm and dry.     Capillary Refill: Capillary refill takes less than 2 seconds.  Neurological:     Mental Status: She is alert.  Psychiatric:     Comments: Tearful         Assessment And Plan:     1. Anxiety and depression  -Continue taking Lexapro 10 mg once daily  -Advised the patient to call her insurance company to see what therapist they cover, recommended she calls them to see if they can accommodate her earlier. -Advised the patient to try relaxation methods such as meditation and massage -Advised patient to reach out to her support system, family and supports and church for support.  -Advised patient she can go to the [facility] if ever needed.  Denies suicidal thoughts or ideas.    Follow up: if symptoms persist or do not get better.   The patient was encouraged  to call or send a message through [place] for any questions or concerns.   Side effects and appropriate use of all the medication(s) were discussed with the patient today. Patient advised to use the medication(s) as directed by their healthcare provider. The patient was encouraged to read, review, and understand all associated package inserts and contact our office with any questions or concerns. The patient accepts the risks of the treatment plan and had an opportunity to ask questions.   Patient was given opportunity to ask questions. Patient verbalized understanding of the plan and was able to repeat key elements of the plan. All questions were answered to their satisfaction.  Raman Jevaun Strick, DNP   I, Raman Geriann Lafont have reviewed all documentation for this visit. The documentation on 10/02/20 for the exam, diagnosis, procedures, and orders are all accurate and  complete.    IF YOU HAVE BEEN REFERRED TO A SPECIALIST, IT MAY TAKE 1-2 WEEKS TO SCHEDULE/PROCESS THE REFERRAL. IF YOU HAVE NOT HEARD FROM US/SPECIALIST IN TWO WEEKS, PLEASE GIVE [place] A CALL AT [phone].   THE PATIENT IS ENCOURAGED TO PRACTICE SOCIAL DISTANCING DUE TO THE COVID-19 PANDEMIC.

## 2020-10-20 ENCOUNTER — Encounter: Payer: Self-pay | Admitting: Nurse Practitioner

## 2020-10-20 ENCOUNTER — Ambulatory Visit (INDEPENDENT_AMBULATORY_CARE_PROVIDER_SITE_OTHER): Payer: No Typology Code available for payment source | Admitting: Nurse Practitioner

## 2020-10-20 ENCOUNTER — Other Ambulatory Visit: Payer: Self-pay

## 2020-10-20 VITALS — BP 128/70 | HR 63 | Temp 98.5°F | Ht 66.6 in | Wt 187.6 lb

## 2020-10-20 DIAGNOSIS — Z6829 Body mass index (BMI) 29.0-29.9, adult: Secondary | ICD-10-CM | POA: Diagnosis not present

## 2020-10-20 DIAGNOSIS — E663 Overweight: Secondary | ICD-10-CM

## 2020-10-20 DIAGNOSIS — F419 Anxiety disorder, unspecified: Secondary | ICD-10-CM | POA: Diagnosis not present

## 2020-10-20 DIAGNOSIS — F32A Depression, unspecified: Secondary | ICD-10-CM

## 2020-10-20 MED ORDER — VORTIOXETINE HBR 5 MG PO TABS
5.0000 mg | ORAL_TABLET | Freq: Every day | ORAL | 2 refills | Status: DC
Start: 2020-10-20 — End: 2020-11-24

## 2020-10-20 MED ORDER — MAGNESIUM 250 MG PO TABS: 250.0000 mg | ORAL_TABLET | Freq: Every evening | ORAL | 0 refills | Status: AC

## 2020-10-20 NOTE — Progress Notes (Signed)
I,Tianna Badgett,acting as a Education administrator for [employer], FNP.,have documented all relevant documentation on the behalf of Minette Brine, FNP,as directed by  Minette Brine, FNP while in the presence of Minette Brine, [place].  This visit occurred during the SARS-CoV-2 public health emergency.  Safety protocols were in place, including screening questions prior to the visit, additional usage of staff PPE, and extensive cleaning of exam room while observing appropriate contact time as indicated for disinfecting solutions.  Subjective:     Patient ID: Wendy Pineda , female    DOB: 10/18/1965 , 55 y.o.   MRN: [id number]   Chief Complaint  Patient presents with   Depression    HPI  Patient is here for a follow up for lexapro. She would like to discuss switching medications. She thinks that this medication has caused her to have dreams. Since being off work she has been sleeping more. She has now stopped taking the medications after having a bad dream that her ex had broken in the house and she has now stopped taking the medication. This occurred about 3-4 days after starting the medication which was after her appt on 10/02/2020.    She has an appt on 7/28 with [facility].     Past Medical History:  Diagnosis Date   Anemia    Carotid bruit    carotid dopplers 12/22/2010, no hemodynamically significant stenosis   GERD (gastroesophageal reflux disease)    occasional-tums prn-overindulgence   Heart murmur    echo 10/11/2007 EF >55%mild, MR   Hx of chest pain    neg. lexiscan myoview 02/23/2011   Hyperlipidemia    treated by diet     Family History  Problem Relation Age of Onset   Breast cancer Mother    Stroke Maternal Grandmother    Cancer Maternal Grandfather      Current Outpatient Medications:    Ascorbic Acid (VITAMIN C PO), Take 1 tablet by mouth daily at 12 noon., Disp: , Rfl:    B Complex Vitamins (B COMPLEX PO), Take 1 packet by mouth daily. Reported on 06/27/2015, Disp: ,  Rfl:    folic acid (FOLVITE) 132 MCG tablet, Take 400 mcg by mouth daily. Reported on 06/27/2015, Disp: , Rfl:    Magnesium 250 MG TABS, Take 1 tablet (250 mg total) by mouth every evening., Disp: 90 tablet, Rfl: 0   Multiple Vitamins-Minerals (ANTIOXIDANT PO), Take 1 packet by mouth daily. Reported on 06/27/2015, Disp: , Rfl:    Multiple Vitamins-Minerals (ZINC PO), Take 1 tablet by mouth daily at 12 noon., Disp: , Rfl:    Omega-3 Fatty Acids (FISH OIL) 1000 MG CAPS, Take by mouth. Reported on 06/27/2015, Disp: , Rfl:    VITAMIN D PO, Take 1 tablet by mouth daily., Disp: , Rfl:    Allergies  Allergen Reactions   Elemental Sulfur    Sulfa Antibiotics Hives and Swelling     Review of Systems  Constitutional: Negative.   Respiratory: Negative.    Cardiovascular: Negative.   Gastrointestinal: Negative.   Neurological: Negative.   Psychiatric/Behavioral:  Positive for sleep disturbance. Negative for hallucinations. The patient is nervous/anxious.     Today's Vitals   10/20/20 1420  BP: 128/70  Pulse: 63  Temp: 98.5 F (36.9 C)  TempSrc: Oral  Weight: 187 lb 9.6 oz (85.1 kg)  Height: 5' 6.6" (1.692 m)   Body mass index is 29.74 kg/m.  Wt Readings from Last 3 Encounters:  10/20/20 187 lb 9.6 oz (  85.1 kg)  10/02/20 190 lb (86.2 kg)  09/18/20 191 lb 12.8 oz (87 kg)    Objective:  Physical Exam Constitutional:      General: She is not in acute distress.    Appearance: Normal appearance.  Cardiovascular:     Rate and Rhythm: Normal rate and regular rhythm.     Pulses: Normal pulses.     Heart sounds: Normal heart sounds. No murmur heard. Pulmonary:     Effort: Pulmonary effort is normal. No respiratory distress.     Breath sounds: Normal breath sounds. No wheezing.  Neurological:     General: No focal deficit present.     Mental Status: She is alert and oriented to person, place, and time.     Cranial Nerves: No cranial nerve deficit.     Motor: No weakness.  Psychiatric:         Mood and Affect: Mood is anxious. Affect is flat.        Behavior: Behavior normal.        Thought Content: Thought content normal.        Judgment: Judgment normal.        Assessment And Plan:     1. Anxiety and depression She is to take magnesium to help with her anxiety.  She has been having nightmares and feeling anxious since taking the escitalopram I will start her on trintellix daily since she is having side effects She is to follow up with Behavioral Health on 7/28.  - Magnesium 250 MG TABS; Take 1 tablet (250 mg total) by mouth every evening.  Dispense: 90 tablet; Refill: 0  2. Overweight with body mass index (BMI) of 29 to 29.9 in adult  She is encouraged to strive for BMI less than 26 to decrease cardiac risk. Advised to aim for at least 150 minutes of exercise per week.  Gene sight done  I personally spent 30 minutes face-to-face and non-face-to-face in the care of this patient, which includes all pre-, intra-, and post visit time on the date of service.  Patient was given opportunity to ask questions. Patient verbalized understanding of the plan and was able to repeat key elements of the plan. All questions were answered to their satisfaction.  Minette Brine, FNP   I, Minette Brine, FNP, have reviewed all documentation for this visit. The documentation on 10/20/20 for the exam, diagnosis, procedures, and orders are all accurate and complete.   IF YOU HAVE BEEN REFERRED TO A SPECIALIST, IT MAY TAKE 1-2 WEEKS TO SCHEDULE/PROCESS THE REFERRAL. IF YOU HAVE NOT HEARD FROM US/SPECIALIST IN TWO WEEKS, PLEASE GIVE [place] A CALL AT [phone].   THE PATIENT IS ENCOURAGED TO PRACTICE SOCIAL DISTANCING DUE TO THE COVID-19 PANDEMIC.

## 2020-10-20 NOTE — Patient Instructions (Signed)
http://APA.org/depression-guideline"> https://clinicalkey.com"> http://point-of-care.elsevierperformancemanager.com/skills/"> http://point-of-care.elsevierperformancemanager.com">  Managing Depression, Adult Depression is a mental health condition that affects your thoughts, feelings, and actions. Being diagnosed with depression can bring you relief if you did not know why you have felt or behaved a certain way. It could also leave you feeling overwhelmed with uncertainty about your future. Preparing yourself tomanage your symptoms can help you feel more positive about your future. How to manage lifestyle changes Managing stress  Stress is your body's reaction to life changes and events, both good and bad. Stress can add to your feelings of depression. Learning to manage your stresscan help lessen your feelings of depression. Try some of the following approaches to reducing your stress (stress reduction techniques): Listen to music that you enjoy and that inspires you. Try using a meditation app or take a meditation class. Develop a practice that helps you connect with your spiritual self. Walk in nature, pray, or go to a place of worship. Do some deep breathing. To do this, inhale slowly through your nose. Pause at the top of your inhale for a few seconds and then exhale slowly, letting your muscles relax. Practice yoga to help relax and work your muscles. Choose a stress reduction technique that suits your lifestyle and personality. These techniques take time and practice to develop. Set aside 5-15 minutes a day to do them. Therapists can offer training in these techniques. Other things you can do to manage stress include: Keeping a stress diary. Knowing your limits and saying no when you think something is too much. Paying attention to how you react to certain situations. You may not be able to control everything, but you can change your reaction. Adding humor to your life by watching funny films  or TV shows. Making time for activities that you enjoy and that relax you.  Medicines Medicines, such as antidepressants, are often a part of treatment for depression. Talk with your pharmacist or health care provider about all the medicines, supplements, and herbal products that you take, their possible side effects, and what medicines and other products are safe to take together. Make sure to report any side effects you may have to your health care provider. Relationships Your health care provider may suggest family therapy, couples therapy, orindividual therapy as part of your treatment. How to recognize changes Everyone responds differently to treatment for depression. As you recover from depression, you may start to: Have more interest in doing activities. Feel less hopeless. Have more energy. Overeat less often, or have a better appetite. Have better mental focus. It is important to recognize if your depression is not getting better or is getting worse. The symptoms you had in the beginning may return, such as: Tiredness (fatigue) or low energy. Eating too much or too little. Sleeping too much or too little. Feeling restless, agitated, or hopeless. Trouble focusing or making decisions. Unexplained physical complaints. Feeling irritable, angry, or aggressive. If you or your family members notice these symptoms coming back, let yourhealth care provider know right away. Follow these instructions at home: Activity  Try to get some form of exercise each day, such as walking, biking, swimming, or lifting weights. Practice stress reduction techniques. Engage your mind by taking a class or doing some volunteer work.  Lifestyle Get the right amount and quality of sleep. Cut down on using caffeine, tobacco, alcohol, and other potentially harmful substances. Eat a healthy diet that includes plenty of vegetables, fruits, whole grains, low-fat dairy products, and lean protein. Do not   eat  a lot of foods that are high in solid fats, added sugars, or salt (sodium). General instructions Take over-the-counter and prescription medicines only as told by your health care provider. Keep all follow-up visits as told by your health care provider. This is important. Where to find support Talking to others  Friends and family members can be sources of support and guidance. Talk to trusted friends or family members about your condition. Explain your symptoms to them, and let them know that you are working with a health care provider to treat your depression. Tell friends and family members how they also can behelpful. Finances Find appropriate mental health providers that fit with your financial situation. Talk with your health care provider about options to get reduced prices on your medicines. Where to find more information You can find support in your area from: Anxiety and Depression Association of America (ADAA): www.adaa.org Mental Health America: www.mentalhealthamerica.net National Alliance on Mental Illness: www.nami.org Contact a health care provider if: You stop taking your antidepressant medicines, and you have any of these symptoms: Nausea. Headache. Light-headedness. Chills and body aches. Not being able to sleep (insomnia). You or your friends and family think your depression is getting worse. Get help right away if: You have thoughts of hurting yourself or others. If you ever feel like you may hurt yourself or others, or have thoughts about taking your own life, get help right away. Go to your nearest emergency department or: Call your local emergency services (911 in the [place] Call a suicide crisis helpline, such as the National Suicide Prevention Lifeline at 1-800-273-8255. This is open 24 hours a day in the [place] Text the Crisis Text Line at 741741 (in the [place] Summary If you are diagnosed with depression, preparing yourself to manage your symptoms is a good way  to feel positive about your future. Work with your health care provider on a management plan that includes stress reduction techniques, medicines (if applicable), therapy, and healthy lifestyle habits. Keep talking with your health care provider about how your treatment is working. If you have thoughts about taking your own life, call a suicide crisis helpline or text a crisis text line. This information is not intended to replace advice given to you by your health care provider. Make sure you discuss any questions you have with your healthcare provider. Document Revised: 01/31/2019 Document Reviewed: 01/31/2019 Elsevier Patient Education  2022 Elsevier Inc.  

## 2020-10-21 ENCOUNTER — Other Ambulatory Visit: Payer: Self-pay

## 2020-10-21 ENCOUNTER — Ambulatory Visit (AMBULATORY_SURGERY_CENTER): Payer: No Typology Code available for payment source

## 2020-10-21 VITALS — Ht 68.8 in | Wt 187.0 lb

## 2020-10-21 DIAGNOSIS — Z1211 Encounter for screening for malignant neoplasm of colon: Secondary | ICD-10-CM

## 2020-10-21 MED ORDER — PEG-KCL-NACL-NASULF-NA ASC-C 100 G PO SOLR: 1.0000 | Freq: Once | ORAL | 0 refills | Status: AC

## 2020-10-21 NOTE — Progress Notes (Signed)
Patient's pre-visit was done today over the phone with the patient Name,DOB and address verified.   Patient denies any allergies to Eggs and Soy. Patient denies any problems with anesthesia/sedation. Patient denies taking diet pills or blood thinners. No home Oxygen. Packet of Prep instructions mailed to patient including a copy of a consent form-pt is aware. Patient understands to call us back with any questions or concerns. Patient is aware of our care-partner policy and [id number] safety protocol.

## 2020-10-24 ENCOUNTER — Encounter: Payer: Self-pay | Admitting: Nurse Practitioner

## 2020-10-30 ENCOUNTER — Telehealth: Payer: Self-pay

## 2020-10-30 ENCOUNTER — Ambulatory Visit: Payer: No Typology Code available for payment source | Admitting: Psychology

## 2020-10-30 NOTE — Telephone Encounter (Signed)
Called pt to remind her of her procedure on 11/03/20. Pt stated that she is not feeling well. Experiencing sore throat, cough, and runny nose. Awaiting covid results. Stated that she will call office to reschedule when she is feeling better.

## 2020-10-30 NOTE — Telephone Encounter (Signed)
Noted! Thank you

## 2020-11-03 ENCOUNTER — Encounter: Payer: No Typology Code available for payment source | Admitting: Gastroenterology

## 2020-11-04 ENCOUNTER — Ambulatory Visit (INDEPENDENT_AMBULATORY_CARE_PROVIDER_SITE_OTHER): Payer: No Typology Code available for payment source | Admitting: Psychology

## 2020-11-04 ENCOUNTER — Ambulatory Visit: Payer: No Typology Code available for payment source | Admitting: Psychology

## 2020-11-04 DIAGNOSIS — F4321 Adjustment disorder with depressed mood: Secondary | ICD-10-CM

## 2020-11-11 ENCOUNTER — Encounter: Payer: Self-pay | Admitting: Nurse Practitioner

## 2020-11-11 ENCOUNTER — Ambulatory Visit (INDEPENDENT_AMBULATORY_CARE_PROVIDER_SITE_OTHER): Payer: No Typology Code available for payment source | Admitting: Nurse Practitioner

## 2020-11-11 ENCOUNTER — Other Ambulatory Visit: Payer: Self-pay

## 2020-11-11 VITALS — BP 110/78 | Wt 187.0 lb

## 2020-11-11 DIAGNOSIS — F419 Anxiety disorder, unspecified: Secondary | ICD-10-CM

## 2020-11-11 DIAGNOSIS — F32A Depression, unspecified: Secondary | ICD-10-CM

## 2020-11-11 NOTE — Patient Instructions (Signed)
[email or web address]">  Depression Screening Depression screening is a tool that your health care provider can use to learn if you have symptoms of depression. Depression is a common condition with many symptoms that are also often found in other conditions. Depression is treatable, but it must first be diagnosed. You may not know that certain feelings, thoughts, and behaviors that you are having can be symptoms of depression. Taking a depression screening test can help you and your health care provider decide if you need more assessment, or if you should be referredto a mental health care provider. What are the screening tests? You may have a physical exam to see if another condition is affecting your mental health. You may have a blood or urine sample taken during the physical exam. You may be interviewed using a screening tool that was developed from research, such as one of these: Patient Health Questionnaire (PHQ). This is a set of either 2 or 9 questions. A health care provider who has been trained to score this screening test uses a guide to assess if your symptoms suggest that you may have depression. Wendy Pineda Depression Rating Scale (HAM-D). This is a set of either 17 or 24 questions. You may be asked to take it again during or after your treatment, to see if your depression has gotten better. Beck Depression Inventory (BDI). This is a set of 21 multiple choice questions. Your health care provider scores your answers to assess: Your level of depression, ranging from mild to severe. Your response to treatment. Your health care provider may talk with you about your daily activities, such as eating, sleeping, work, and recreation, and ask if you have had any changes in activity. Your health care provider may ask you to see a mental health specialist, such as a psychiatrist or psychologist, for more evaluation. Who should be  screened for depression?  All adults, including adults with a family history of a mental health disorder. Adolescents who are 4-65 years old. People who are recovering from a myocardial infarction (MI). Pregnant women, or women who have given birth. People who have a long-term (chronic) illness. Anyone who has been diagnosed with another type of a mental health disorder. Anyone who has symptoms that could show depression. What do my results mean? Your health care provider will review the results of your depression screening, physical exam, and lab tests. Positive screens suggest that you may have depression. Screening is the first step in getting the care that you may need. It is up to you to get your screening results. Ask your health care provider, or the department that is doing your screening tests, when your results will beready. Talk with your health care provider about your results and diagnosis. A diagnosis of depression is made using the Diagnostic and Statistical Manual of Mental Disorders (DSM-V). This is a book that lists the number and type of symptoms that mustbe present for a health care provider to give a specific diagnosis.  Your health care provider may work with you to treat your symptoms of depression, or your health care provider may help you find a mental healthprovider who can assess, diagnose, and treat your depression. Get help right away if: You have thoughts about hurting yourself or others. If you ever feel like you may hurt yourself or others, or have thoughts about taking your own life, get help right away. You can go to your nearest emergency department or call: Your local emergency services (911 in the [place] A suicide  crisis helpline, such as the [place] at 913-561-4014. This is open 24 hours a day. Summary Depression screening is the first step in getting the help that you may need. If your screening test shows symptoms of  depression (is positive), your health care provider may ask you to see a mental health provider. Anyone who is age 53 or older should be screened for depression. This information is not intended to replace advice given to you by your health care provider. Make sure you discuss any questions you have with your healthcare provider. Document Revised: 03/05/2020 Document Reviewed: 09/13/2019 Elsevier Patient Education  [place] Anxiety Disorder, Adult Social anxiety disorder (SAD), previously called social phobia, is a mental health condition. People with SAD often feel nervous, afraid, or embarrassed when they are around other people in social situations. They worry that other people are judging or criticizing them for how they look, what they say, or howthey act. SAD involves more than just feeling shy or self-conscious at times. It can cause severe emotional distress. It can interfere with activities of dailylife. SAD also may lead to alcohol or drug use, and even suicide. SAD is a common mental health condition. It can develop at any time, but itusually starts in the teenage years. What are the causes? The cause of this condition is not known. It may involve genes that are passed through families. Stressful events may trigger anxiety. This disorder is also associated with an overactive amygdala. The amygdala is the part of the brainthat triggers your response to strong feelings, such as fear. What increases the risk? This condition is more likely to develop in: People who have a family history of anxiety disorders. Women. People who have a physical or behavioral condition that makes them feel self-conscious or nervous, such as a stutter or a long-term (chronic) disease. What are the signs or symptoms? The main symptom of this condition is fear of embarrassment caused by being criticized or judged in social situations. You may be afraid to: Speak in public. Go shopping. Use a  public bathroom. Eat at a restaurant. Go to work. Interact with people you do not know. Extreme fear and anxiety may cause physical symptoms, including: Blushing. A fast heartbeat. Sweating. Shaky hands or voice. Confusion. Light-headedness. Upset stomach, diarrhea, or vomiting. Shortness of breath. How is this diagnosed? This condition is diagnosed based on your history, symptoms, and behavior in social situations. You may be diagnosed with this type of anxiety if your symptoms have lasted for more than 6 months and have been present on more daysthan not. Your health care provider may ask you about your use of alcohol, drugs, and prescription medicines. He or she may also refer you to a mental healthspecialist for further evaluation or treatment. How is this treated? Treatment for this condition may include: Cognitive behavioral therapy (CBT). This type of talk therapy helps you learn to replace negative thoughts and behaviors with positive ones. This may include learning how to use self-calming skills and other methods of managing your anxiety. Exposure therapy. You will be exposed to social situations that cause you fear. The treatment starts with practicing self-calming in situations that cause you low levels of fear. Over time, you will progress by sustaining self-calming and managing harder situations. Antidepressant medicines. These medicines may be used by themselves or in addition to other therapies. Biofeedback. This process trains you to manage your body's response (physiological response) through breathing techniques and relaxation methods. You will  work with a Transport planner while machines are used to monitor your physical symptoms. Techniques for relaxation and managing anxiety. These include deep breathing, self-talk, meditation, visual imagery, muscle relaxation, music therapy, and yoga. These techniques are often used with other therapies to keep you calm in situations that cause  you anxiety. These treatments are often used in combination. Follow these instructions at home: Alcohol use If you drink alcohol: Limit how much you use to: 0-1 drink a day for nonpregnant women. 0-2 drinks a day for men. Be aware of how much alcohol is in your drink. In the [place] one drink equals one 12 oz bottle of beer (355 mL), one 5 oz glass of wine (148 mL), or one 1 oz glass of hard liquor (44 mL). General instructions Take over-the-counter and prescription medicines only as told by your health care provider. Practice techniques for relaxation and managing anxiety at times you are not challenged by social anxiety. Return to social activities using techniques you have learned, as you feel ready to do so. Avoid caffeine and certain over-the-counter cold medicines. These may make you feel worse. Ask your pharmacists which medicines to avoid. Keep all follow-up visits as told by your health care provider. This is important. Where to find more information [employer] on Mental Illness (NAMI): https://www.nami.org Social Anxiety Association: https://socialphobia.[place] Day): PipeCollectors.no Anxiety and Depression Association of [place] (ADAA): FeeTelevision.cz Contact a health care provider if: Your symptoms do not improve or get worse. You have signs of depression, such as: Persistent sadness or moodiness. Loss of enjoyment in activities that used to bring you joy. Change in weight or eating. Changes in sleeping habits. Avoiding friends or family members more than usual. Loss of energy for normal tasks. Feeling guilty or worthless. You become more isolated than you normally are. You find it more and more difficult to speak or interact with others. You are using drugs. You are drinking more alcohol than usual. Get help right away if: You harm yourself. You have suicidal thoughts. If you ever feel like you may hurt yourself or others, or  have thoughts about taking your own life, get help right away. You can go to your nearest emergency department or call: Your local emergency services (911 in the [place] A suicide crisis helpline, such as the [facility] at 815-086-1442. This is open 24 hours a day. Summary Social anxiety disorder (SAD) may cause you to feel nervous, afraid, or embarrassed when you are around other people in social situations. SAD is a common mental disorder. It can develop at any time, but it usually starts in the teenage years. Treatment includes talk therapy, exposure therapy, medicines, biofeedback, and relaxation techniques. It can involve a combination of treatments. This information is not intended to replace advice given to you by your health care provider. Make sure you discuss any questions you have with your healthcare provider. Document Revised: 08/23/2018 Document Reviewed: 08/23/2018 Elsevier Patient Education  [place].
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
## 2020-11-24 ENCOUNTER — Other Ambulatory Visit: Payer: Self-pay

## 2020-11-24 ENCOUNTER — Ambulatory Visit (INDEPENDENT_AMBULATORY_CARE_PROVIDER_SITE_OTHER): Payer: No Typology Code available for payment source | Admitting: Psychology

## 2020-11-24 DIAGNOSIS — F4321 Adjustment disorder with depressed mood: Secondary | ICD-10-CM

## 2020-11-24 DIAGNOSIS — F419 Anxiety disorder, unspecified: Secondary | ICD-10-CM

## 2020-11-24 MED ORDER — VORTIOXETINE HBR 5 MG PO TABS: 5.0000 mg | ORAL_TABLET | Freq: Every day | ORAL | 2 refills | Status: DC

## 2020-12-01 ENCOUNTER — Ambulatory Visit: Payer: No Typology Code available for payment source

## 2020-12-02 ENCOUNTER — Other Ambulatory Visit: Payer: Self-pay

## 2020-12-02 ENCOUNTER — Ambulatory Visit
Admission: RE | Admit: 2020-12-02 | Discharge: 2020-12-02 | Disposition: A | Discharge status: Home / Self Care | Payer: No Typology Code available for payment source | Source: Ambulatory Visit | Attending: Nurse Practitioner | Admitting: Nurse Practitioner

## 2020-12-02 DIAGNOSIS — Z1231 Encounter for screening mammogram for malignant neoplasm of breast: Secondary | ICD-10-CM

## 2020-12-04 ENCOUNTER — Ambulatory Visit (INDEPENDENT_AMBULATORY_CARE_PROVIDER_SITE_OTHER): Payer: No Typology Code available for payment source | Admitting: Psychology

## 2020-12-04 DIAGNOSIS — F4321 Adjustment disorder with depressed mood: Secondary | ICD-10-CM

## 2020-12-10 ENCOUNTER — Ambulatory Visit: Payer: No Typology Code available for payment source | Admitting: Psychology

## 2020-12-10 DIAGNOSIS — F4321 Adjustment disorder with depressed mood: Secondary | ICD-10-CM

## 2020-12-15 ENCOUNTER — Other Ambulatory Visit: Payer: Self-pay

## 2020-12-15 DIAGNOSIS — F32A Depression, unspecified: Secondary | ICD-10-CM

## 2020-12-15 MED ORDER — VORTIOXETINE HBR 5 MG PO TABS: 5.0000 mg | ORAL_TABLET | Freq: Every day | ORAL | 2 refills | Status: DC

## 2020-12-18 ENCOUNTER — Ambulatory Visit: Payer: No Typology Code available for payment source | Admitting: Psychology

## 2020-12-18 DIAGNOSIS — F4321 Adjustment disorder with depressed mood: Secondary | ICD-10-CM

## 2020-12-25 ENCOUNTER — Ambulatory Visit: Payer: No Typology Code available for payment source | Admitting: Psychology

## 2020-12-25 DIAGNOSIS — F4321 Adjustment disorder with depressed mood: Secondary | ICD-10-CM

## 2020-12-31 ENCOUNTER — Ambulatory Visit: Payer: Self-pay | Admitting: Psychology

## 2020-12-31 ENCOUNTER — Encounter: Payer: Self-pay | Admitting: Nurse Practitioner

## 2020-12-31 LAB — HM PAP SMEAR

## 2021-01-01 ENCOUNTER — Encounter: Payer: No Typology Code available for payment source | Admitting: Nurse Practitioner

## 2021-01-02 ENCOUNTER — Other Ambulatory Visit: Payer: Self-pay

## 2021-01-02 ENCOUNTER — Ambulatory Visit (HOSPITAL_COMMUNITY)
Admission: EM | Admit: 2021-01-02 | Discharge: 2021-01-02 | Disposition: A | Discharge status: Home / Self Care | Payer: No Typology Code available for payment source | Attending: Physician Assistant | Admitting: Physician Assistant

## 2021-01-02 ENCOUNTER — Encounter (HOSPITAL_COMMUNITY): Payer: Self-pay | Admitting: Emergency Medicine

## 2021-01-02 DIAGNOSIS — R3989 Other symptoms and signs involving the genitourinary system: Secondary | ICD-10-CM | POA: Diagnosis not present

## 2021-01-02 DIAGNOSIS — M79644 Pain in right finger(s): Secondary | ICD-10-CM | POA: Insufficient documentation

## 2021-01-02 LAB — CBC WITH DIFFERENTIAL/PLATELET
Abs Immature Granulocytes: 0.02 10*3/uL (ref 0.00–0.07)
Basophils Absolute: 0 10*3/uL (ref 0.0–0.1)
Basophils Relative: 1 %
Eosinophils Absolute: 0.1 10*3/uL (ref 0.0–0.5)
Eosinophils Relative: 2 %
HCT: 40.9 % (ref 36.0–46.0)
Hemoglobin: 13.5 g/dL (ref 12.0–15.0)
Immature Granulocytes: 0 %
Lymphocytes Relative: 37 %
Lymphs Abs: 1.8 10*3/uL (ref 0.7–4.0)
MCH: 26.8 pg (ref 26.0–34.0)
MCHC: 33 g/dL (ref 30.0–36.0)
MCV: 81.3 fL (ref 80.0–100.0)
Monocytes Absolute: 0.7 10*3/uL (ref 0.1–1.0)
Monocytes Relative: 15 %
Neutro Abs: 2.2 10*3/uL (ref 1.7–7.7)
Neutrophils Relative %: 45 %
Platelets: 354 10*3/uL (ref 150–400)
RBC: 5.03 MIL/uL (ref 3.87–5.11)
RDW: 12.8 % (ref 11.5–15.5)
WBC: 4.9 10*3/uL (ref 4.0–10.5)
nRBC: 0 % (ref 0.0–0.2)

## 2021-01-02 LAB — POCT URINALYSIS DIPSTICK, ED / UC
Bilirubin Urine: NEGATIVE
Glucose, UA: NEGATIVE mg/dL
Hgb urine dipstick: NEGATIVE
Ketones, ur: NEGATIVE mg/dL
Leukocytes,Ua: NEGATIVE
Nitrite: NEGATIVE
Protein, ur: NEGATIVE mg/dL
Specific Gravity, Urine: 1.02 (ref 1.005–1.030)
Urobilinogen, UA: 0.2 mg/dL (ref 0.0–1.0)
pH: 5.5 (ref 5.0–8.0)

## 2021-01-02 LAB — COMPREHENSIVE METABOLIC PANEL
ALT: 18 U/L (ref 0–44)
AST: 22 U/L (ref 15–41)
Albumin: 3.8 g/dL (ref 3.5–5.0)
Alkaline Phosphatase: 104 U/L (ref 38–126)
Anion gap: 9 (ref 5–15)
BUN: 10 mg/dL (ref 6–20)
CO2: 23 mmol/L (ref 22–32)
Calcium: 9.3 mg/dL (ref 8.9–10.3)
Chloride: 106 mmol/L (ref 98–111)
Creatinine, Ser: 0.66 mg/dL (ref 0.44–1.00)
GFR, Estimated: >60 mL/min (ref >60)
Glucose, Bld: 86 mg/dL (ref 70–99)
Potassium: 4 mmol/L (ref 3.5–5.1)
Sodium: 138 mmol/L (ref 135–145)
Total Bilirubin: 0.6 mg/dL (ref 0.3–1.2)
Total Protein: 6.9 g/dL (ref 6.5–8.1)

## 2021-01-02 MED ORDER — DOXYCYCLINE HYCLATE 100 MG PO CAPS: 100.0000 mg | ORAL_CAPSULE | Freq: Two times a day (BID) | ORAL | 0 refills | Status: DC

## 2021-01-02 NOTE — ED Triage Notes (Signed)
Pt reports killed a fruit fly with lateral side of hand then little later noticed bruising and swelling to right thumb.

## 2021-01-02 NOTE — Discharge Instructions (Addendum)
Take antibiotic as prescribed. Follow up with any further concerns.

## 2021-01-02 NOTE — ED Provider Notes (Signed)
[facility]    CSN: [id number] Arrival date & time: 01/02/21  1235      History   Chief Complaint Chief Complaint  Patient presents with   Finger Injury    HPI Wendy Pineda is a 55 y.o. female.   Patient here today for evaluation of mild swelling and bruising to distal right thumb that she first noticed today.  She states this occurred after she had hit the lateral side of her hand trying to kill a fruit fly.  She reports that she is not having pain on that side of her hand.  She denies any fever or chills.  She does not have bruising elsewhere.  She denies any injury to her thumb.  She does note her urine has been bright yellow for the last few days.  She has been wearing a waist trainer and not sure if this is related.    Past Medical History:  Diagnosis Date   Anemia    Anxiety    Carotid bruit    carotid dopplers 12/22/2010, no hemodynamically significant stenosis   Depression    GERD (gastroesophageal reflux disease)    occasional-tums prn-overindulgence   Heart murmur    echo 10/11/2007 EF >55%mild, MR   Hx of chest pain    neg. lexiscan myoview 02/23/2011   Hyperlipidemia    treated by diet    Patient Active Problem List   Diagnosis Date Noted   Hypercholesteremia 12/24/2017   Vaginitis 12/24/2017   Anemia 12/24/2017   Acute pharyngitis 12/24/2017    Past Surgical History:  Procedure Laterality Date   colonoscopy     DILATION AND CURETTAGE OF UTERUS  2009   [facility]   DILITATION & CURRETTAGE/HYSTROSCOPY WITH VERSAPOINT RESECTION N/A 01/06/2015   Procedure: DILATATION & CURETTAGE/HYSTEROSCOPY WITH VERSAPOINT RESECTION;  Surgeon: Princess Bruins, MD;  Location: [facility];  Service: Gynecology;  Laterality: N/A;    OB History   No obstetric history on file.      Home Medications    Prior to Admission medications   Medication Sig Start Date End Date Taking? Authorizing Provider  doxycycline (VIBRAMYCIN) 100 MG capsule Take 1 capsule (100 mg  total) by mouth 2 (two) times daily. 01/02/21  Yes Francene Finders, PA-C  Ascorbic Acid (VITAMIN C PO) Take 1 tablet by mouth daily at 12 noon.    [provider]  B Complex Vitamins (B COMPLEX PO) Take 1 packet by mouth daily. Reported on 06/27/2015    [provider]  folic acid (FOLVITE) 353 MCG tablet Take 400 mcg by mouth daily. Reported on 06/27/2015    [provider]  Magnesium 250 MG TABS Take 1 tablet (250 mg total) by mouth every evening. 10/20/20   Minette Brine, FNP  Multiple Vitamins-Minerals (ANTIOXIDANT PO) Take 1 packet by mouth daily. Reported on 06/27/2015    [provider]  Multiple Vitamins-Minerals (ZINC PO) Take 1 tablet by mouth daily at 12 noon.    [provider]  Omega-3 Fatty Acids (FISH OIL) 1000 MG CAPS Take by mouth. Reported on 06/27/2015    [provider]  VITAMIN D PO Take 1 tablet by mouth daily.    [provider]  vortioxetine HBr (TRINTELLIX) 5 MG TABS tablet Take 1 tablet (5 mg total) by mouth daily. 12/15/20   Minette Brine, FNP    Family History Family History  Problem Relation Age of Onset   Colon polyps Mother    Breast cancer Mother  Stroke Maternal Grandmother    Colon cancer Maternal Grandfather    Cancer Maternal Grandfather    Esophageal cancer Neg Hx    Rectal cancer Neg Hx    Stomach cancer Neg Hx     Social History Social History   Tobacco Use   Smoking status: Never   Smokeless tobacco: Never  Vaping Use   Vaping Use: Never used  Substance Use Topics   Alcohol use: No   Drug use: No     Allergies   Elemental sulfur and Sulfa antibiotics   Review of Systems Review of Systems  Constitutional:  Negative for chills and fever.  Eyes:  Negative for discharge and redness.  Respiratory:  Negative for shortness of breath.   Gastrointestinal:  Negative for nausea and vomiting.  Musculoskeletal:  Negative for arthralgias.  Skin:  Positive for color change. Negative  for wound.  Neurological:  Negative for numbness.    Physical Exam Triage Vital Signs ED Triage Vitals  Enc Vitals Group     BP 01/02/21 1246 (!) 146/91     Pulse Rate 01/02/21 1246 78     Resp 01/02/21 1246 18     Temp 01/02/21 1246 98.6 F (37 C)     Temp Source 01/02/21 1246 Oral     SpO2 01/02/21 1246 97 %     Weight --      Height --      Head Circumference --      Peak Flow --      Pain Score 01/02/21 1245 3     Pain Loc --      Pain Edu? --      Excl. in [place]? --    No data found.  Updated Vital Signs BP (!) 146/91 (BP Location: Left Arm)   Pulse 78   Temp 98.6 F (37 C) (Oral)   Resp 18   LMP 03/14/2017   SpO2 97%      Physical Exam Vitals and nursing note reviewed.  Constitutional:      General: She is not in acute distress.    Appearance: Normal appearance. She is not ill-appearing.  HENT:     Head: Normocephalic and atraumatic.  Eyes:     Conjunctiva/sclera: Conjunctivae normal.  Cardiovascular:     Rate and Rhythm: Normal rate.  Pulmonary:     Effort: Pulmonary effort is normal.  Musculoskeletal:     Comments: Mild decreased ROM of right thumb due to swelling, bruising and swelling noted to right thumb finger pad, no evidence of wound, drainage, etc.  Skin:    General: Skin is warm and dry.     Capillary Refill: Normal cap refill to right thumb Neurological:     Mental Status: She is alert.     Comments: Gross sensation intact to distal right thumb  Psychiatric:        Mood and Affect: Mood normal.        Behavior: Behavior normal.        Thought Content: Thought content normal.     UC Treatments / Results  Labs (all labs ordered are listed, but only abnormal results are displayed) Labs Reviewed  CBC WITH DIFFERENTIAL/PLATELET  COMPREHENSIVE METABOLIC PANEL  POCT URINALYSIS DIPSTICK, ED / UC    EKG   Radiology No results found.  Procedures Procedures (including critical care time)  Medications Ordered in UC Medications - No  data to display  Initial Impression / Assessment and Plan / UC Course  I  have reviewed the triage vital signs and the nursing notes.  Pertinent labs & imaging results that were available during my care of the patient were reviewed by me and considered in my medical decision making (see chart for details).  Urinalysis with normal results.  Will order CBC CMP for further evaluation as well.  Suspect likely cellulitis, will treat with antibiotic to cover same.  Encouraged follow-up with any further concerns or symptoms worsen anyway.  Final Clinical Impressions(s) / UC Diagnoses   Final diagnoses:  Finger pain, right  Abnormal urine color     Discharge Instructions      Take antibiotic as prescribed. Follow up with any further concerns.      ED Prescriptions     Medication Sig Dispense Auth. Provider   doxycycline (VIBRAMYCIN) 100 MG capsule Take 1 capsule (100 mg total) by mouth 2 (two) times daily. 20 capsule Francene Finders, PA-C      PDMP not reviewed this encounter.   Francene Finders, PA-C 01/02/21 1339

## 2021-01-05 ENCOUNTER — Ambulatory Visit (INDEPENDENT_AMBULATORY_CARE_PROVIDER_SITE_OTHER): Payer: Self-pay | Admitting: Psychology

## 2021-01-05 DIAGNOSIS — F4321 Adjustment disorder with depressed mood: Secondary | ICD-10-CM

## 2021-01-16 ENCOUNTER — Ambulatory Visit (INDEPENDENT_AMBULATORY_CARE_PROVIDER_SITE_OTHER): Payer: No Typology Code available for payment source | Admitting: Psychology

## 2021-01-16 DIAGNOSIS — F4321 Adjustment disorder with depressed mood: Secondary | ICD-10-CM

## 2021-01-21 ENCOUNTER — Encounter: Payer: Self-pay | Admitting: Nurse Practitioner

## 2021-01-21 ENCOUNTER — Other Ambulatory Visit: Payer: Self-pay

## 2021-01-21 ENCOUNTER — Ambulatory Visit (INDEPENDENT_AMBULATORY_CARE_PROVIDER_SITE_OTHER): Payer: No Typology Code available for payment source | Admitting: Nurse Practitioner

## 2021-01-21 VITALS — BP 130/74 | HR 79 | Temp 98.1°F | Ht 67.2 in | Wt 185.8 lb

## 2021-01-21 DIAGNOSIS — E559 Vitamin D deficiency, unspecified: Secondary | ICD-10-CM | POA: Diagnosis not present

## 2021-01-21 DIAGNOSIS — Z23 Encounter for immunization: Secondary | ICD-10-CM

## 2021-01-21 DIAGNOSIS — E7800 Pure hypercholesterolemia, unspecified: Secondary | ICD-10-CM | POA: Diagnosis not present

## 2021-01-21 DIAGNOSIS — F32A Depression, unspecified: Secondary | ICD-10-CM

## 2021-01-21 DIAGNOSIS — F419 Anxiety disorder, unspecified: Secondary | ICD-10-CM

## 2021-01-21 DIAGNOSIS — Z0000 Encounter for general adult medical examination without abnormal findings: Secondary | ICD-10-CM | POA: Diagnosis not present

## 2021-01-21 DIAGNOSIS — Z2821 Immunization not carried out because of patient refusal: Secondary | ICD-10-CM

## 2021-01-21 MED ORDER — ESCITALOPRAM OXALATE 10 MG PO TABS: 10.0000 mg | ORAL_TABLET | Freq: Every day | ORAL | 2 refills | Status: DC

## 2021-01-21 NOTE — Progress Notes (Signed)
I,Tianna Badgett,acting as a Education administrator for [employer], NP.,have documented all relevant documentation on the behalf of [employer], NP,as directed by  Bary Castilla, NP while in the presence of Bary Castilla, NP.  This visit occurred during the SARS-CoV-2 public health emergency.  Safety protocols were in place, including screening questions prior to the visit, additional usage of staff PPE, and extensive cleaning of exam room while observing appropriate contact time as indicated for disinfecting solutions.  Subjective:     Patient ID: Wendy Pineda , female    DOB: [birth year] , 55 y.o.   MRN: [id number]   Chief Complaint  Patient presents with   Annual Exam    HPI  Here for HM. She is followed by Dr Garwin Brothers for her GYN care. She was last seen by her last month. She is not taking the trintellix that was prescribed for her as she ran out about a month ago and her insurance is not covering it. She would like to try something else. She is going through a divorce which is causing her some stress/anxiety. She denies suicidal thoughts. Patient had a mammogram this year.  She had to reschedule her colonoscopy due to conflicting schedule No other concerns    Past Medical History:  Diagnosis Date   Anemia    Anxiety    Carotid bruit    carotid dopplers 12/22/2010, no hemodynamically significant stenosis   Depression    GERD (gastroesophageal reflux disease)    occasional-tums prn-overindulgence   Heart murmur    echo 10/11/2007 EF >55%mild, MR   Hx of chest pain    neg. lexiscan myoview 02/23/2011   Hyperlipidemia    treated by diet     Family History  Problem Relation Age of Onset   Colon polyps Mother    Breast cancer Mother    Stroke Maternal Grandmother    Colon cancer Maternal Grandfather    Cancer Maternal Grandfather    Esophageal cancer Neg Hx    Rectal cancer Neg Hx    Stomach cancer Neg Hx      Current Outpatient Medications:    Ascorbic Acid (VITAMIN C  PO), Take 1 tablet by mouth daily at 12 noon., Disp: , Rfl:    B Complex Vitamins (B COMPLEX PO), Take 1 packet by mouth daily. Reported on 06/27/2015, Disp: , Rfl:    doxycycline (VIBRAMYCIN) 100 MG capsule, Take 1 capsule (100 mg total) by mouth 2 (two) times daily., Disp: 20 capsule, Rfl: 0   escitalopram (LEXAPRO) 10 MG tablet, Take 1 tablet (10 mg total) by mouth daily., Disp: 30 tablet, Rfl: 2   folic acid (FOLVITE) 342 MCG tablet, Take 400 mcg by mouth daily. Reported on 06/27/2015, Disp: , Rfl:    Magnesium 250 MG TABS, Take 1 tablet (250 mg total) by mouth every evening., Disp: 90 tablet, Rfl: 0   Multiple Vitamins-Minerals (ZINC PO), Take 1 tablet by mouth daily at 12 noon., Disp: , Rfl:    Omega-3 Fatty Acids (FISH OIL) 1000 MG CAPS, Take by mouth. Reported on 06/27/2015, Disp: , Rfl:    VITAMIN D PO, Take 1 tablet by mouth daily., Disp: , Rfl:    Allergies  Allergen Reactions   Elemental Sulfur    Sulfa Antibiotics Hives and Swelling      The patient states she uses none for birth control. Last LMP was Patient's last menstrual period was 03/14/2017.. . Negative for: breast discharge, breast lump(s), breast pain and breast self exam. Associated  symptoms include abnormal vaginal bleeding. Pertinent negatives include abnormal bleeding (hematology), anxiety, decreased libido, depression, difficulty falling sleep, dyspareunia, history of infertility, nocturia, sexual dysfunction, sleep disturbances, urinary incontinence, urinary urgency, vaginal discharge and vaginal itching. Diet regular.The patient states her exercise level is    . The patient's tobacco use is:  Social History   Tobacco Use  Smoking Status Never  Smokeless Tobacco Never  . She has been exposed to passive smoke. The patient's alcohol use is:  Social History   Substance and Sexual Activity  Alcohol Use No  . Additional information: Last pap 2021, next one scheduled for 2024.    Review of Systems  Constitutional:  Negative.  Negative for chills, fatigue and fever.  HENT: Negative.  Negative for congestion and sinus pain.   Eyes: Negative.   Respiratory: Negative.    Cardiovascular: Negative.   Gastrointestinal: Negative.  Negative for abdominal distention, abdominal pain, constipation, diarrhea and nausea.  Endocrine: Negative.  Negative for polydipsia, polyphagia and polyuria.  Genitourinary: Negative.   Musculoskeletal: Negative.  Negative for arthralgias, back pain and myalgias.  Skin: Negative.   Allergic/Immunologic: Negative.   Neurological: Negative.  Negative for dizziness, weakness and numbness.  Hematological: Negative.   Psychiatric/Behavioral: Negative.  Negative for suicidal ideas.     Today's Vitals   01/21/21 1415  BP: 130/74  Pulse: 79  Temp: 98.1 F (36.7 C)  TempSrc: Oral  Weight: 185 lb 12.8 oz (84.3 kg)  Height: 5' 7.2" (1.707 m)   Body mass index is 28.93 kg/m.  Wt Readings from Last 3 Encounters:  01/21/21 185 lb 12.8 oz (84.3 kg)  11/11/20 187 lb (84.8 kg)  10/21/20 187 lb (84.8 kg)    Objective:  Physical Exam Vitals and nursing note reviewed. Exam conducted with a chaperone present.  Constitutional:      Appearance: Normal appearance. She is obese.  HENT:     Head: Normocephalic and atraumatic.     Right Ear: Tympanic membrane, ear canal and external ear normal. There is no impacted cerumen.     Left Ear: Tympanic membrane, ear canal and external ear normal. There is no impacted cerumen.     Nose: Nose normal. No congestion or rhinorrhea.     Mouth/Throat:     Mouth: Mucous membranes are moist.     Pharynx: No oropharyngeal exudate or posterior oropharyngeal erythema.  Eyes:     Extraocular Movements: Extraocular movements intact.     Conjunctiva/sclera: Conjunctivae normal.     Pupils: Pupils are equal, round, and reactive to light.  Cardiovascular:     Rate and Rhythm: Normal rate and regular rhythm.     Pulses: Normal pulses.     Heart sounds:  Normal heart sounds. No murmur heard. Pulmonary:     Effort: Pulmonary effort is normal. No respiratory distress.     Breath sounds: Normal breath sounds. No wheezing.  Chest:     Chest wall: No mass.  Breasts:    Tanner Score is 5.     Right: Normal.     Left: Normal.  Abdominal:     General: Abdomen is flat. Bowel sounds are normal.     Palpations: Abdomen is soft.  Genitourinary:    Comments: deferred Musculoskeletal:        General: Normal range of motion.     Cervical back: Normal range of motion and neck supple.  Skin:    General: Skin is warm and dry.     Capillary Refill: Capillary  refill takes less than 2 seconds.  Neurological:     General: No focal deficit present.     Mental Status: She is alert and oriented to person, place, and time.  Psychiatric:        Mood and Affect: Mood normal.        Behavior: Behavior normal.     Assessment And Plan:     1. Encounter for general adult medical examination w/o abnormal findings --Patient is here for their annual physical exam and we discussed any changes to medication and medical history.  -Behavior modification was discussed as well as diet and exercise history  -Patient will continue to exercise regularly and modify their diet.  -Recommendation for yearly physical annuals, immunization and screenings including mammogram and colonoscopy were discussed with the patient.  -Recommended intake of multivitamin, vitamin D and calcium.  -Individualized advise was given to the patient pertaining to their own health history in regards to diet, exercise, medical condition and referrals.  - CBC - Hemoglobin A1c - CMP14+EGFR  2. Hypercholesteremia --Educated patient about a diet that is low in fat and high fatty foods including dairy products. Increase in take of fish and fiber. Decrease intake of red meats and fast foods. Exercise for atleast 4-5 times a week or atleast 30-45 min. Drink a lot of water.   - Lipid panel  3.  Anxiety and depression -She was taking trintillex before and stopped taking it 1 month ago because she ran out of it and her insurance did not cover.  -Advised patient to get therapy/counseling.  -Will start her on lexapro.  - escitalopram (LEXAPRO) 10 MG tablet; Take 1 tablet (10 mg total) by mouth daily.  Dispense: 30 tablet; Refill: 2  4. Influenza vaccination declined -Risks and benefits given to patient about the influenza   5. Vitamin D deficiency -Will check and supplement if needed. Advised patient to spend atleast 15 min. Daily in sunlight.  - Vitamin D (25 hydroxy)  6. Need for Tdap vaccination - Tdap vaccine greater than or equal to 7yo IM  The patient was encouraged to call or send a message through [facility] for any questions or concerns.   Follow up: if symptoms persist or do not get better.   Side effects and appropriate use of all the medication(s) were discussed with the patient today. Patient advised to use the medication(s) as directed by their healthcare provider. The patient was encouraged to read, review, and understand all associated package inserts and contact our office with any questions or concerns. The patient accepts the risks of the treatment plan and had an opportunity to ask questions.   Staying healthy and adopting a healthy lifestyle for your overall health is important. You should eat 7 or more servings of fruits and vegetables per day. You should drink plenty of water to keep yourself hydrated and your kidneys healthy. This includes about 65-80+ fluid ounces of water. Limit your intake of animal fats especially for elevated cholesterol. Avoid highly processed food and limit your salt intake if you have hypertension. Avoid foods high in saturated/Trans fats. Along with a healthy diet it is also very important to maintain time for yourself to maintain a healthy mental health with low stress levels. You should get atleast 150 min of moderate intensity exercise  weekly for a healthy heart. Along with eating right and exercising, aim for at least 7-9 hours of sleep daily.  Eat more whole grains which includes barley, wheat berries, oats, brown rice  and whole wheat pasta. Use healthy plant oils which include olive, soy, corn, sunflower and peanut. Limit your caffeine and sugary drinks. Limit your intake of fast foods. Limit milk and dairy products to one or two daily servings.   Patient was given opportunity to ask questions. Patient verbalized understanding of the plan and was able to repeat key elements of the plan. All questions were answered to their satisfaction.  Raman Lethia Donlon, DNP   I, Raman Darline Faith have reviewed all documentation for this visit. The documentation on  01/21/21 for the exam, diagnosis, procedures, and orders are all accurate and complete.    THE PATIENT IS ENCOURAGED TO PRACTICE SOCIAL DISTANCING DUE TO THE COVID-19 PANDEMIC.

## 2021-01-21 NOTE — Patient Instructions (Signed)
Health Maintenance, Female Adopting a healthy lifestyle and getting preventive care are important in promoting health and wellness. Ask your health care provider about: The right schedule for you to have regular tests and exams. Things you can do on your own to prevent diseases and keep yourself healthy. What should I know about diet, weight, and exercise? Eat a healthy diet  Eat a diet that includes plenty of vegetables, fruits, low-fat dairy products, and lean protein. Do not eat a lot of foods that are high in solid fats, added sugars, or sodium. Maintain a healthy weight Body mass index (BMI) is used to identify weight problems. It estimates body fat based on height and weight. Your health care provider can help determine your BMI and help you achieve or maintain a healthy weight. Get regular exercise Get regular exercise. This is one of the most important things you can do for your health. Most adults should: Exercise for at least 150 minutes each week. The exercise should increase your heart rate and make you sweat (moderate-intensity exercise). Do strengthening exercises at least twice a week. This is in addition to the moderate-intensity exercise. Spend less time sitting. Even light physical activity can be beneficial. Watch cholesterol and blood lipids Have your blood tested for lipids and cholesterol at 53 years of age, then have this test every 5 years. Have your cholesterol levels checked more often if: Your lipid or cholesterol levels are high. You are older than 53 years of age. You are at high risk for heart disease. What should I know about cancer screening? Depending on your health history and family history, you may need to have cancer screening at various ages. This may include screening for: Breast cancer. Cervical cancer. Colorectal cancer. Skin cancer. Lung cancer. What should I know about heart disease, diabetes, and high blood pressure? Blood pressure and heart  disease High blood pressure causes heart disease and increases the risk of stroke. This is more likely to develop in people who have high blood pressure readings, are of African descent, or are overweight. Have your blood pressure checked: Every 3-5 years if you are 18-39 years of age. Every year if you are 40 years old or older. Diabetes Have regular diabetes screenings. This checks your fasting blood sugar level. Have the screening done: Once every three years after age 40 if you are at a normal weight and have a low risk for diabetes. More often and at a younger age if you are overweight or have a high risk for diabetes. What should I know about preventing infection? Hepatitis B If you have a higher risk for hepatitis B, you should be screened for this virus. Talk with your health care provider to find out if you are at risk for hepatitis B infection. Hepatitis C Testing is recommended for: Everyone born from 1945 through 1965. Anyone with known risk factors for hepatitis C. Sexually transmitted infections (STIs) Get screened for STIs, including gonorrhea and chlamydia, if: You are sexually active and are younger than 53 years of age. You are older than 53 years of age and your health care provider tells you that you are at risk for this type of infection. Your sexual activity has changed since you were last screened, and you are at increased risk for chlamydia or gonorrhea. Ask your health care provider if you are at risk. Ask your health care provider about whether you are at high risk for HIV. Your health care provider may recommend a prescription medicine   to help prevent HIV infection. If you choose to take medicine to prevent HIV, you should first get tested for HIV. You should then be tested every 3 months for as long as you are taking the medicine. Pregnancy If you are about to stop having your period (premenopausal) and you may become pregnant, seek counseling before you get  pregnant. Take 400 to 800 micrograms (mcg) of folic acid every day if you become pregnant. Ask for birth control (contraception) if you want to prevent pregnancy. Osteoporosis and menopause Osteoporosis is a disease in which the bones lose minerals and strength with aging. This can result in bone fractures. If you are 65 years old or older, or if you are at risk for osteoporosis and fractures, ask your health care provider if you should: Be screened for bone loss. Take a calcium or vitamin D supplement to lower your risk of fractures. Be given hormone replacement therapy (HRT) to treat symptoms of menopause. Follow these instructions at home: Lifestyle Do not use any products that contain nicotine or tobacco, such as cigarettes, e-cigarettes, and chewing tobacco. If you need help quitting, ask your health care provider. Do not use street drugs. Do not share needles. Ask your health care provider for help if you need support or information about quitting drugs. Alcohol use Do not drink alcohol if: Your health care provider tells you not to drink. You are pregnant, may be pregnant, or are planning to become pregnant. If you drink alcohol: Limit how much you use to 0-1 drink a day. Limit intake if you are breastfeeding. Be aware of how much alcohol is in your drink. In the [place] one drink equals one 12 oz bottle of beer (355 mL), one 5 oz glass of wine (148 mL), or one 1 oz glass of hard liquor (44 mL). General instructions Schedule regular health, dental, and eye exams. Stay current with your vaccines. Tell your health care provider if: You often feel depressed. You have ever been abused or do not feel safe at home. Summary Adopting a healthy lifestyle and getting preventive care are important in promoting health and wellness. Follow your health care provider's instructions about healthy diet, exercising, and getting tested or screened for diseases. Follow your health care provider's  instructions on monitoring your cholesterol and blood pressure. This information is not intended to replace advice given to you by your health care provider. Make sure you discuss any questions you have with your health care provider. Document Revised: 05/30/2020 Document Reviewed: 03/15/2018 Elsevier Patient Education  2022 [employer]  

## 2021-01-22 ENCOUNTER — Other Ambulatory Visit: Payer: Self-pay | Admitting: Nurse Practitioner

## 2021-01-22 DIAGNOSIS — E559 Vitamin D deficiency, unspecified: Secondary | ICD-10-CM

## 2021-01-22 LAB — CMP14+EGFR
ALT: 19 IU/L (ref 0–32)
AST: 18 IU/L (ref 0–40)
Albumin/Globulin Ratio: 1.8 (ref 1.2–2.2)
Albumin: 4.7 g/dL (ref 3.8–4.9)
Alkaline Phosphatase: 144 IU/L — ABNORMAL HIGH (ref 44–121)
BUN/Creatinine Ratio: 12 (ref 9–23)
BUN: 8 mg/dL (ref 6–24)
Bilirubin Total: 0.2 mg/dL (ref 0.0–1.2)
CO2: 24 mmol/L (ref 20–29)
Calcium: 9.4 mg/dL (ref 8.7–10.2)
Chloride: 102 mmol/L (ref 96–106)
Creatinine, Ser: 0.69 mg/dL (ref 0.57–1.00)
Globulin, Total: 2.6 g/dL (ref 1.5–4.5)
Glucose: 88 mg/dL (ref 70–99)
Potassium: 4.4 mmol/L (ref 3.5–5.2)
Sodium: 141 mmol/L (ref 134–144)
Total Protein: 7.3 g/dL (ref 6.0–8.5)
eGFR: 104 mL/min/{1.73_m2} (ref >59)

## 2021-01-22 LAB — LIPID PANEL
Chol/HDL Ratio: 3.3 ratio (ref 0.0–4.4)
Cholesterol, Total: 280 mg/dL — ABNORMAL HIGH (ref 100–199)
HDL: 84 mg/dL (ref >39)
LDL Chol Calc (NIH): 181 mg/dL — ABNORMAL HIGH (ref 0–99)
Triglycerides: 93 mg/dL (ref 0–149)
VLDL Cholesterol Cal: 15 mg/dL (ref 5–40)

## 2021-01-22 LAB — CBC
Hematocrit: 42 % (ref 34.0–46.6)
Hemoglobin: 13.7 g/dL (ref 11.1–15.9)
MCH: 26.9 pg (ref 26.6–33.0)
MCHC: 32.6 g/dL (ref 31.5–35.7)
MCV: 82 fL (ref 79–97)
Platelets: 343 10*3/uL (ref 150–450)
RBC: 5.1 x10E6/uL (ref 3.77–5.28)
RDW: 13.6 % (ref 11.7–15.4)
WBC: 5.6 10*3/uL (ref 3.4–10.8)

## 2021-01-22 LAB — HEMOGLOBIN A1C
Est. average glucose Bld gHb Est-mCnc: 114 mg/dL
Hgb A1c MFr Bld: 5.6 % (ref 4.8–5.6)

## 2021-01-22 LAB — VITAMIN D 25 HYDROXY (VIT D DEFICIENCY, FRACTURES): Vit D, 25-Hydroxy: 17.9 ng/mL — ABNORMAL LOW (ref 30.0–100.0)

## 2021-01-22 MED ORDER — VITAMIN D (ERGOCALCIFEROL) 1.25 MG (50000 UNIT) PO CAPS: 50000.0000 [IU] | ORAL_CAPSULE | ORAL | 1 refills | Status: DC

## 2021-01-27 ENCOUNTER — Ambulatory Visit (INDEPENDENT_AMBULATORY_CARE_PROVIDER_SITE_OTHER): Payer: No Typology Code available for payment source | Admitting: Psychology

## 2021-01-27 DIAGNOSIS — F4321 Adjustment disorder with depressed mood: Secondary | ICD-10-CM | POA: Diagnosis not present

## 2021-02-02 ENCOUNTER — Encounter: Payer: Self-pay | Admitting: Nurse Practitioner

## 2021-02-03 NOTE — Progress Notes (Signed)
Sometimes elevated lipid panel can also cause elevation in  liver enzymes. We will recheck it at your next visit.

## 2021-02-04 ENCOUNTER — Ambulatory Visit (INDEPENDENT_AMBULATORY_CARE_PROVIDER_SITE_OTHER): Payer: No Typology Code available for payment source | Admitting: Psychology

## 2021-02-04 DIAGNOSIS — F4321 Adjustment disorder with depressed mood: Secondary | ICD-10-CM | POA: Diagnosis not present

## 2021-02-19 ENCOUNTER — Ambulatory Visit (INDEPENDENT_AMBULATORY_CARE_PROVIDER_SITE_OTHER): Payer: No Typology Code available for payment source | Admitting: Psychology

## 2021-02-19 DIAGNOSIS — F4321 Adjustment disorder with depressed mood: Secondary | ICD-10-CM

## 2021-03-05 ENCOUNTER — Encounter: Payer: Self-pay | Admitting: Gastroenterology

## 2021-03-17 ENCOUNTER — Ambulatory Visit (AMBULATORY_SURGERY_CENTER): Payer: No Typology Code available for payment source

## 2021-03-17 ENCOUNTER — Other Ambulatory Visit: Payer: Self-pay

## 2021-03-17 VITALS — Ht 67.5 in | Wt 183.0 lb

## 2021-03-17 DIAGNOSIS — Z1211 Encounter for screening for malignant neoplasm of colon: Secondary | ICD-10-CM

## 2021-03-17 MED ORDER — PEG-KCL-NACL-NASULF-NA ASC-C 100 G PO SOLR: 1.0000 | Freq: Once | ORAL | 0 refills | Status: AC

## 2021-03-17 NOTE — Progress Notes (Signed)
Pre visit completed via phone call; Patient verified name, DOB, and address; No egg or soy allergy known to patient  No issues known to pt with past sedation with any surgeries or procedures Patient denies ever being told they had issues or difficulty with intubation  No FH of Malignant Hyperthermia Pt is not on diet pills Pt is not on home 02  Pt is not on blood thinners  Pt reports issues with constipation - uses lemon teas to assist with relief of constipation-advised increase po fluids, activity, and fresh fruits/veggies No A fib or A flutter NO PA's for preps discussed with pt in PV today  Discussed with pt there will be an out-of-pocket cost for prep and that varies from $0 to 70 + dollars - pt verbalized understanding  Due to the COVID-19 pandemic we are asking patients to follow certain guidelines in PV and the [place]   Pt aware of COVID protocols and [place] guidelines   Patient decided on Moviprep instead of Suprep- RX sent

## 2021-03-18 ENCOUNTER — Ambulatory Visit (INDEPENDENT_AMBULATORY_CARE_PROVIDER_SITE_OTHER): Payer: No Typology Code available for payment source | Admitting: Psychology

## 2021-03-18 DIAGNOSIS — F4321 Adjustment disorder with depressed mood: Secondary | ICD-10-CM | POA: Diagnosis not present

## 2021-03-18 NOTE — Progress Notes (Signed)
[facility] Counselor/Therapist Progress Note  Patient ID: Wendy Pineda, MRN: [id number],    Date: 03/18/2021  Time Spent: 50 mins  Treatment Type: Individual Therapy  Reported Symptoms: Pt presents for today's session, via webex video, due to virus outbreak.  Pt shares that she is in her home with no one else present.  I shared with pt that I am in my office with no one else here with me either.  Pt shares that she has tried to give plasma for money to get her heat turned back on.  Pt shares that she ended up with a hematoma from that process and this was one other difficulty for her in her life.  Her attempt was not successful so she has no heat in her home at this time.  Mental Status Exam: Appearance:  Casual     Behavior: Appropriate  Motor: Normal  Speech/Language:  Clear and Coherent  Affect: Appropriate  Mood: normal  Thought process: normal  Thought content:   WNL  Sensory/Perceptual disturbances:   WNL  Orientation: oriented to person, place, and time/date  Attention: Good  Concentration: Good  Memory: WNL  Fund of knowledge:  Good  Insight:   Good  Judgment:  Good  Impulse Control: Good   Risk Assessment: Danger to Self:  No Self-injurious Behavior: No Danger to Others: No Duty to Warn:no Physical Aggression / Violence:No  Access to Firearms a concern: No  Gang Involvement:No   Subjective: Pt shares she had a court date on Monday, a doctor's appt on Tuesday and has another court date tomorrow.  Pt is feeling overwhelmed still at this point with everything that is going on for her.  Pt shares she is feeling physically better today than she has felt since last week when she tried to give plasma.  Pt is also working on completing her LTD paperwork and we discussed some of her questions about the form.  She will take part of the form to her PCP due to the medication she is currently taking and she includes our practice name and contact info for the  [place] to send [place] documentation to complete.  Pt shares that she has not been sleeping well recently and attributes this to the stress of her court case and not having legal representation.  Pt shares that she did go to the [place] agency last week to see if she could get some monetary support for her utility bills.  She was able to get some information, but did not get any financial assistance.  Pt shares that all of this situation "has been a faith walk for me.  I am trying to trust God and he is introducing me to very nice and very supportive people for me."  Pt shares that her sister had a birthday recently and pt did not have gas to make it to [facility] for the celebration.  She was able to go to [facility] on Sunday to be with her family and celebrate her sister's birthday; pt truly benefited from their emotional support.  Pt continues to benefit from her spiritual life and feels supported by her belief in God and in her spiritual life.  Encouraged pt to continue with her self care activities and we will meet in  wks for a follow up session.    Interventions: Cognitive Behavioral Therapy  Diagnosis:Adjustment disorder with depressed mood  Plan: Treatment Plan Strengths/Abilities:  Intelligent, Intuitive, Willing to participate in therapy Treatment Preferences:  Outpatient Individual Therapy  Statement of Needs:  Patient is to use CBT, mindfulness and coping skills to help manage and/or decrease symptoms associated with their diagnosis. Symptoms:  Depressed/Irritable mood, worry, social withdrawal Problems Addressed:  Depressive thoughts, Sadness, Sleep issues, etc. Long Term Goals:  Pt to reduce overall level, frequency, and intensity of the feelings of depression as evidenced by decreased irritability, negative self talk, and helpless feelings from 6 to 7 days/week to 0 to 1 days/week, per client report, for at least 3 consecutive months. Short Term Goals:  Pt to verbally express understanding of the  relationship between feelings of depression and their impact on thinking patterns and behaviors.  Pt to verbalize an understanding of the role that distorted thinking plays in creating fears, excessive worry, and ruminations. Target Date:  03/19/2022 Frequency:  Bi-weekly Modality:  Cognitive Behavioral Therapy Interventions by Therapist:  Therapist will use CBT, Mindfulness exercises, Coping skills and Referrals, as needed by client.  Ivan Anchors, [facility]

## 2021-03-26 ENCOUNTER — Ambulatory Visit (INDEPENDENT_AMBULATORY_CARE_PROVIDER_SITE_OTHER): Payer: No Typology Code available for payment source | Admitting: Psychology

## 2021-03-26 DIAGNOSIS — F4321 Adjustment disorder with depressed mood: Secondary | ICD-10-CM | POA: Diagnosis not present

## 2021-03-26 NOTE — Progress Notes (Signed)
[facility] Counselor/Therapist Progress Note  Patient ID: Wendy Pineda, MRN: [id number],    Date: 03/26/2021  Time Spent: 50 mins  Treatment Type: Individual Therapy  Reported Symptoms: Pt presents for today's session, via webex video, due to virus outbreak.  Pt shares that she is in her home with no one else present.  I shared with pt that I am in my office with no one else here with me either.  Pt shares she continues to work on ways to get her heat turned back on in her home.  She is concerned about how cold it will be for the rest of this week and she has no heat in her home.   Mental Status Exam: Appearance:  Casual     Behavior: Appropriate  Motor: Normal  Speech/Language:  Clear and Coherent  Affect: Appropriate  Mood: normal  Thought process: normal  Thought content:   WNL  Sensory/Perceptual disturbances:   WNL  Orientation: oriented to person, place, and time/date  Attention: Good  Concentration: Good  Memory: WNL  Fund of knowledge:  Good  Insight:   Good  Judgment:  Good  Impulse Control: Good   Risk Assessment: Danger to Self:  No Self-injurious Behavior: No Danger to Others: No Duty to Warn:no Physical Aggression / Violence:No  Access to Firearms a concern: No  Gang Involvement:No   Subjective: Pt shares she is scheduled to return to court tomorrow.  Pt is still feeling overwhelmed by having to represent herself in her financial settlement work regarding her divorce.  With having having no heat in her home, I encouraged pt to consider going to [facility] to stay with her sister or her mom during this cold period.  Pt is planning to contact her ex-husband's attorney to see if they can help her with her utility bills until the settlement is reached.  Pt continues to lean on her faith in this difficult situation, but she is emotionally struggling with the stress.  It seems as we talk that pt is doing her best to manage the court issues but is clearly  overwhelmed by the stress of it all.  Pt continues to be in communication with her supervisor and she has encouraged pt continue to apply for LTD and pt provided some paperwork for me to complete.  I asked pt some clarifying questions about the form during this session and will take the form to our office this evening so it can be faxed back to the [facility] tomorrow morning.  Pt shares that her "head is so full of thoughts and I cannot concentrate and focus.  I feel like I need to be more organized but I just can't do it right now." Encouraged pt to continue to nurture herself with family visits this weekend for the holiday and we will meet again next week for a follow up session.     Interventions: Cognitive Behavioral Therapy  Diagnosis:Adjustment disorder with depressed mood  Plan: Treatment Plan Strengths/Abilities:  Intelligent, Intuitive, Willing to participate in therapy Treatment Preferences:  Outpatient Individual Therapy Statement of Needs:  Patient is to use CBT, mindfulness and coping skills to help manage and/or decrease symptoms associated with their diagnosis. Symptoms:  Depressed/Irritable mood, worry, social withdrawal Problems Addressed:  Depressive thoughts, Sadness, Sleep issues, etc. Long Term Goals:  Pt to reduce overall level, frequency, and intensity of the feelings of depression as evidenced by decreased irritability, negative self talk, and helpless feelings from 6 to 7 days/week to 0  to 1 days/week, per client report, for at least 3 consecutive months. Short Term Goals:  Pt to verbally express understanding of the relationship between feelings of depression and their impact on thinking patterns and behaviors.  Pt to verbalize an understanding of the role that distorted thinking plays in creating fears, excessive worry, and ruminations. Target Date:  03/19/2022 Frequency:  Weekly Modality:  Cognitive Behavioral Therapy Interventions by Therapist:  Therapist will use CBT,  Mindfulness exercises, Coping skills and Referrals, as needed by client.  Ivan Anchors, [facility]

## 2021-03-31 ENCOUNTER — Ambulatory Visit (AMBULATORY_SURGERY_CENTER): Payer: No Typology Code available for payment source | Admitting: Gastroenterology

## 2021-03-31 ENCOUNTER — Encounter: Payer: Self-pay | Admitting: Gastroenterology

## 2021-03-31 VITALS — BP 146/91 | HR 52 | Temp 97.5°F | Resp 14 | Ht 67.5 in | Wt 183.0 lb

## 2021-03-31 DIAGNOSIS — K621 Rectal polyp: Secondary | ICD-10-CM

## 2021-03-31 DIAGNOSIS — Z8371 Family history of colonic polyps: Secondary | ICD-10-CM | POA: Diagnosis not present

## 2021-03-31 DIAGNOSIS — K635 Polyp of colon: Secondary | ICD-10-CM

## 2021-03-31 DIAGNOSIS — D125 Benign neoplasm of sigmoid colon: Secondary | ICD-10-CM

## 2021-03-31 DIAGNOSIS — D128 Benign neoplasm of rectum: Secondary | ICD-10-CM

## 2021-03-31 DIAGNOSIS — Z1211 Encounter for screening for malignant neoplasm of colon: Secondary | ICD-10-CM | POA: Diagnosis present

## 2021-03-31 DIAGNOSIS — Z800 Family history of malignant neoplasm of digestive organs: Secondary | ICD-10-CM

## 2021-03-31 MED ORDER — SODIUM CHLORIDE 0.9 % IV SOLN: 500.0000 mL | Freq: Once | INTRAVENOUS | Status: DC

## 2021-03-31 NOTE — Op Note (Signed)
[facility] Patient Name: Wendy Pineda Procedure Date: 03/31/2021 2:22 PM MRN: [id number] Endoscopist: Thornton Park MD, MD Age: 55 Referring MD:  Date of Birth: 1966-03-26 Gender: Female Account #: [id number] Procedure:                Colonoscopy Indications:              Screening for colorectal malignant neoplasm, This                            is the patient's first colonoscopy                           Mother with colon polyps                           Maternal grandfather with colon cancer Medicines:                Monitored Anesthesia Care Procedure:                Pre-Anesthesia Assessment:                           - Prior to the procedure, a History and Physical                            was performed, and patient medications and                            allergies were reviewed. The patient's tolerance of                            previous anesthesia was also reviewed. The risks                            and benefits of the procedure and the sedation                            options and risks were discussed with the patient.                            All questions were answered, and informed consent                            was obtained. Prior Anticoagulants: The patient has                            taken no previous anticoagulant or antiplatelet                            agents. ASA Grade Assessment: II - A patient with                            mild systemic disease. After reviewing the risks  and benefits, the patient was deemed in                            satisfactory condition to undergo the procedure.                           After obtaining informed consent, the colonoscope                            was passed under direct vision. Throughout the                            procedure, the patient's blood pressure, pulse, and                            oxygen saturations were monitored continuously. The                             Colonoscope was introduced through the anus and                            advanced to the 3 cm into the ileum. A second                            forward view of the right colon was performed. The                            colonoscopy was performed without difficulty. The                            patient tolerated the procedure well. The quality                            of the bowel preparation was good. The terminal                            ileum, ileocecal valve, appendiceal orifice, and                            rectum were photographed. Scope In: 2:54:23 PM Scope Out: 3:12:25 PM Scope Withdrawal Time: 0 hours 13 minutes 19 seconds  Total Procedure Duration: 0 hours 18 minutes 2 seconds  Findings:                 The perianal and digital rectal examinations were                            normal.                           Three sessile polyps were found in the rectum. The                            polyps were 2 to 3 mm in size. These polyps were  removed with a cold snare. Resection and retrieval                            were complete. Estimated blood loss was minimal.                           A 3 mm polyp was found in the sigmoid colon. The                            polyp was sessile. The polyp was removed with a                            cold snare. Resection and retrieval were complete.                            Estimated blood loss was minimal.                           The exam was otherwise without abnormality on                            direct and retroflexion views. Complications:            No immediate complications. Estimated blood loss:                            Minimal. Estimated Blood Loss:     Estimated blood loss was minimal. Impression:               - Three 2 to 3 mm polyps in the rectum, removed                            with a cold snare. Resected and retrieved.                           - One 3 mm polyp  in the sigmoid colon, removed with                            a cold snare. Resected and retrieved.                           - The examination was otherwise normal on direct                            and retroflexion views. Recommendation:           - Patient has a contact number available for                            emergencies. The signs and symptoms of potential                            delayed complications were discussed with the  patient. Return to normal activities tomorrow.                            Written discharge instructions were provided to the                            patient.                           - Resume previous diet.                           - Continue present medications.                           - Await pathology results.                           - Repeat colonoscopy date to be determined after                            pending pathology results are reviewed for                            surveillance.                           - Emerging evidence supports eating a diet of                            fruits, vegetables, grains, calcium, and yogurt                            while reducing red meat and alcohol may reduce the                            risk of colon cancer.                           - Thank you for allowing me to be involved in your                            colon cancer prevention. Thornton Park MD, MD 03/31/2021 3:18:46 PM This report has been signed electronically.

## 2021-03-31 NOTE — Progress Notes (Signed)
PT taken to PACU. Monitors in place. VSS. Report given to RN. 

## 2021-03-31 NOTE — Progress Notes (Signed)
Referring Provider: Minette Brine, FNP Primary Care Physician:  Minette Brine, FNP  Reason for Procedure:  Colon cancer screening   IMPRESSION:  Need for colon cancer screening Appropriate candidate for monitored anesthesia care  PLAN: Colonoscopy in the [facility] today   HPI: Wendy Pineda is a 55 y.o. female presents for screening colonoscopy.  No prior colonoscopy or colon cancer screening.  No baseline GI symptoms.   Mother with polyps. Maternal grandfather with colon cancer. No other known family history of colon cancer or polyps. No family history of uterine/endometrial cancer, pancreatic cancer or gastric/stomach cancer.   Past Medical History:  Diagnosis Date   Anemia    Anxiety    on meds   Arthritis    bilateral knees   Carotid bruit    carotid dopplers 12/22/2010, no hemodynamically significant stenosis   Depression    on meds   GERD (gastroesophageal reflux disease)    occasional-tums prn-overindulgence   Heart murmur    echo 10/11/2007 EF >55%mild, MR   Hx of chest pain    neg. lexiscan myoview 02/23/2011   Hyperlipidemia    diet controlled   Sickle cell trait ([place])     Past Surgical History:  Procedure Laterality Date   DILATION AND CURETTAGE OF UTERUS  04/06/2007   [facility]   DILATION AND CURETTAGE OF UTERUS     due to fibroids  x surgeries   DILITATION & CURRETTAGE/HYSTROSCOPY WITH VERSAPOINT RESECTION N/A 01/06/2015   Procedure: DILATATION & CURETTAGE/HYSTEROSCOPY WITH VERSAPOINT RESECTION;  Surgeon: Princess Bruins, MD;  Location: [facility];  Service: Gynecology;  Laterality: N/A;    Current Outpatient Medications  Medication Sig Dispense Refill   Ascorbic Acid (VITAMIN C PO) Take 1 tablet by mouth daily at 12 noon.     B Complex Vitamins (B COMPLEX PO) Take 1 packet by mouth daily. Reported on 06/27/2015     COLLAGEN PO Take 1 Scoop by mouth every other day.     Magnesium 250 MG TABS Take 1 tablet (250 mg total) by mouth every evening. 90 tablet 0    Multiple Vitamin (MULTIVITAMIN PO) Take 1 tablet by mouth daily at 2 am.     Multiple Vitamins-Minerals (ZINC PO) Take 1 tablet by mouth daily at 12 noon. (Patient not taking: Reported on 03/17/2021)     peg 3350 powder (MOVIPREP) 100 g SOLR Take by mouth. Follow instructions for prep     VITAMIN D PO Take 1 tablet by mouth daily.     vortioxetine HBr (TRINTELLIX) 5 MG TABS tablet Take 5 mg by mouth daily.     Current Facility-Administered Medications  Medication Dose Route Frequency Provider Last Rate Last Admin   0.9 %  sodium chloride infusion  500 mL Intravenous Once Thornton Park, MD        Allergies as of 03/31/2021 - Review Complete 03/31/2021  Allergen Reaction Noted   Sulfa antibiotics Hives, Swelling, and Rash 11/29/2011   Elemental sulfur Hives, Swelling, and Rash 12/24/2017    Family History  Problem Relation Age of Onset   Colon polyps Mother 34   Breast cancer Mother    Stroke Maternal Grandmother    Colon polyps Maternal Grandfather 72   Colon cancer Maternal Grandfather 72   Esophageal cancer Neg Hx    Rectal cancer Neg Hx    Stomach cancer Neg Hx      Physical Exam: General:   Alert,  well-nourished, pleasant and cooperative in NAD Head:  Normocephalic and atraumatic.  Eyes:  Sclera clear, no icterus.   Conjunctiva pink. Mouth:  No deformity or lesions.   Neck:  Supple; no masses or thyromegaly. Lungs:  Clear throughout to auscultation.   No wheezes. Heart:  Regular rate and rhythm; no murmurs. Abdomen:  Soft, non-tender, nondistended, normal bowel sounds, no rebound or guarding.  Msk:  Symmetrical. No boney deformities LAD: No inguinal or umbilical LAD Extremities:  No clubbing or edema. Neurologic:  Alert and  oriented x4;  grossly nonfocal Skin:  No obvious rash or bruise. Psych:  Alert and cooperative. Normal mood and affect.     Studies/Results: No results found.    Mckade Gurka L. Tarri Glenn, MD, MPH 03/31/2021, 2:31 PM

## 2021-03-31 NOTE — Patient Instructions (Signed)

## 2021-03-31 NOTE — Progress Notes (Signed)
VS  PJ  Pt's states no medical or surgical changes since previsit or office visit.

## 2021-03-31 NOTE — Progress Notes (Signed)
Called to room to assist during endoscopic procedure.  Patient ID and intended procedure confirmed with present staff. Received instructions for my participation in the procedure from the performing physician.  

## 2021-04-01 ENCOUNTER — Ambulatory Visit (INDEPENDENT_AMBULATORY_CARE_PROVIDER_SITE_OTHER): Payer: No Typology Code available for payment source | Admitting: Psychology

## 2021-04-01 DIAGNOSIS — F4321 Adjustment disorder with depressed mood: Secondary | ICD-10-CM

## 2021-04-01 NOTE — Progress Notes (Signed)
[facility] Counselor/Therapist Progress Note  Patient ID: Wendy Pineda, MRN: [id number],    Date: 04/01/2021  Time Spent: 50 mins  Treatment Type: Individual Therapy  Reported Symptoms: Pt presents for today's session, via webex video, due to virus outbreak.  Pt shares that she is in her mom's home with no one else present.  I shared with pt that I am in my office with no one else here with me either.  Pt shares that she had her colonoscopy yesterday and her mom took her to and from the procedure.  She is staying with her mom for a few days.  She also enjoyed Christmas with her family as well; pt's face lights up when she talks about the time.     Mental Status Exam: Appearance:  Casual     Behavior: Appropriate  Motor: Normal  Speech/Language:  Clear and Coherent  Affect: Appropriate  Mood: normal  Thought process: normal  Thought content:   WNL  Sensory/Perceptual disturbances:   WNL  Orientation: oriented to person, place, and time/date  Attention: Good  Concentration: Good  Memory: WNL  Fund of knowledge:  Good  Insight:   Good  Judgment:  Good  Impulse Control: Good   Risk Assessment: Danger to Self:  No Self-injurious Behavior: No Danger to Others: No Duty to Warn:no Physical Aggression / Violence:No  Access to Firearms a concern: No  Gang Involvement:No   Subjective: Pt shares she had 4 polyps removed from her colon and those were sent to the lab for evaluation.  She really "enjoyed being around family for Christmas."  Pt shares she did take her appeal paperwork to the court last week and still has something to complete for the court in [place] for the appeal.  Pt shares that she is starting to get frustrated with the legal process.  She has a court date for 04/24/21 for the equitable distribution.  She has talked with a woman at the Legal Aid office who was helpful initially, but not so much now.  Pt has also been in contact with her supervisor at work  about her request for LTD and supervisor has been supportive of pt.  Pt shares, "I still have a lot on my plate.  I am thinking about selling my rental property and that would get me out of debt."  Pt continues to struggle with decision making and is struggling with what is the best things for her to do.  Encouraged pt to talk with people that she trusts during these times.  Pt shares that she has been"loyal to a fault, in the past.  Sometimes I don't move on soon enough."  Encouraged pt to continue with her self care activities and surrounding herself with people who are supportive of her.  We will meet in 2 wks for a follow up session.   Interventions: Cognitive Behavioral Therapy  Diagnosis:Adjustment disorder with depressed mood  Plan: Treatment Plan Strengths/Abilities:  Intelligent, Intuitive, Willing to participate in therapy Treatment Preferences:  Outpatient Individual Therapy Statement of Needs:  Patient is to use CBT, mindfulness and coping skills to help manage and/or decrease symptoms associated with their diagnosis. Symptoms:  Depressed/Irritable mood, worry, social withdrawal Problems Addressed:  Depressive thoughts, Sadness, Sleep issues, etc. Long Term Goals:  Pt to reduce overall level, frequency, and intensity of the feelings of depression as evidenced by decreased irritability, negative self talk, and helpless feelings from 6 to 7 days/week to 0 to 1 days/week, per client  report, for at least 3 consecutive months.  Progress: 20% Short Term Goals:  Pt to verbally express understanding of the relationship between feelings of depression and their impact on thinking patterns and behaviors.  Pt to verbalize an understanding of the role that distorted thinking plays in creating fears, excessive worry, and ruminations.  Progress: 20% Target Date:  03/19/2022 Frequency:  Weekly Modality:  Cognitive Behavioral Therapy Interventions by Therapist:  Therapist will use CBT, Mindfulness  exercises, Coping skills and Referrals, as needed by client.  Ivan Anchors, [facility]

## 2021-04-02 ENCOUNTER — Telehealth: Payer: Self-pay

## 2021-04-02 ENCOUNTER — Telehealth: Payer: Self-pay | Admitting: *Deleted

## 2021-04-02 NOTE — Telephone Encounter (Signed)
°  Follow up Call-  Call back number 03/31/2021  Post procedure Call Back phone  # [phone]  Permission to leave phone message Yes  Some recent data might be hidden     Patient questions:  No message left. Mailbox is full.

## 2021-04-02 NOTE — Telephone Encounter (Signed)
°  Follow up Call-  Call back number 03/31/2021  Post procedure Call Back phone  # [phone]  Permission to leave phone message Yes  Some recent data might be hidden     Patient questions:  Do you have a fever, pain , or abdominal swelling? No. Pain Score  0 *  Have you tolerated food without any problems? Yes.    Have you been able to return to your normal activities? Yes.    Do you have any questions about your discharge instructions: Diet   No. Medications  No. Follow up visit  No.  Do you have questions or concerns about your Care? No.  Actions: * If pain score is 4 or above: No action needed, pain <4.

## 2021-04-07 ENCOUNTER — Encounter: Payer: Self-pay | Admitting: Gastroenterology

## 2021-04-16 ENCOUNTER — Ambulatory Visit (INDEPENDENT_AMBULATORY_CARE_PROVIDER_SITE_OTHER): Payer: No Typology Code available for payment source | Admitting: Psychology

## 2021-04-16 DIAGNOSIS — F4321 Adjustment disorder with depressed mood: Secondary | ICD-10-CM | POA: Diagnosis not present

## 2021-04-16 NOTE — Progress Notes (Signed)
[place] Counselor/Therapist Progress Note  Patient ID: Wendy Pineda, MRN: [id number],    Date: 04/16/2021  Time Spent: 50 mins  Treatment Type: Individual Therapy  Reported Symptoms: Pt presents for today's session, via webex video, due to virus outbreak.  Pt shares that she is in her home with no one else present.  I shared with pt that I am in my office with no one else here with me either.     Mental Status Exam: Appearance:  Casual     Behavior: Appropriate  Motor: Normal  Speech/Language:  Clear and Coherent  Affect: Appropriate  Mood: normal  Thought process: normal  Thought content:   WNL  Sensory/Perceptual disturbances:   WNL  Orientation: oriented to person, place, and time/date  Attention: Good  Concentration: Good  Memory: WNL  Fund of knowledge:  Good  Insight:   Good  Judgment:  Good  Impulse Control: Good   Risk Assessment: Danger to Self:  No Self-injurious Behavior: No Danger to Others: No Duty to Warn:no Physical Aggression / Violence:No  Access to Firearms a concern: No  Gang Involvement:No   Subjective: Pt shares she still has not had success in having her natural gas service turned back on.  The DSS gave her $434.00 toward her $1200.00 outstanding bill; she has talked with a church that can help and also the [employer].  She has decided to put her rental property up for sale on Sunday; she is hopeful that it will sell quickly for a good price.  The proceeds from the sale of the home will help pt to be more financially secure.  Pt shares that she is still participating in her prayer group and the leader of the group has been pressuring her to use her spiritual gifts and she needs to get involved with that right away.  Pt shares, "I feel like I just need to focus on taking care of me right now; I have too much stuff going on right now."  Congratulated pt for being able to focus on her needs at this time.  Pt shares that her next court  date is 1/20 for the equitable distribution.  She has to turn in her appeal today after our session.  Pt is aware that I had completed her LTD paperwork and pt went to our office to sign her consent for [place] to release those forms to the [place].  Pt shares she continues to listen to music and to ministers online, has been reading her Bible regularly, and attending her small group Bible study.  She also went to the [employer] yesterday to do some laundry and pt felt good about being able to accomplish that task.  Encouraged pt to continue with her self care activities and we will meet next week for a follow up session.    Interventions: Cognitive Behavioral Therapy  Diagnosis:Adjustment disorder with depressed mood  Plan: Treatment Plan Strengths/Abilities:  Intelligent, Intuitive, Willing to participate in therapy Treatment Preferences:  Outpatient Individual Therapy Statement of Needs:  Patient is to use CBT, mindfulness and coping skills to help manage and/or decrease symptoms associated with their diagnosis. Symptoms:  Depressed/Irritable mood, worry, social withdrawal Problems Addressed:  Depressive thoughts, Sadness, Sleep issues, etc. Long Term Goals:  Pt to reduce overall level, frequency, and intensity of the feelings of depression as evidenced by decreased irritability, negative self talk, and helpless feelings from 6 to 7 days/week to 0 to 1 days/week, per client report, for at  least 3 consecutive months.  Progress: 20% Short Term Goals:  Pt to verbally express understanding of the relationship between feelings of depression and their impact on thinking patterns and behaviors.  Pt to verbalize an understanding of the role that distorted thinking plays in creating fears, excessive worry, and ruminations.  Progress: 20% Target Date:  03/19/2022 Frequency:  Weekly Modality:  Cognitive Behavioral Therapy Interventions by Therapist:  Therapist will use CBT, Mindfulness exercises, Coping skills  and Referrals, as needed by client.   Ivan Anchors, [facility]

## 2021-04-22 ENCOUNTER — Ambulatory Visit (INDEPENDENT_AMBULATORY_CARE_PROVIDER_SITE_OTHER): Payer: No Typology Code available for payment source | Admitting: Psychology

## 2021-04-22 DIAGNOSIS — F4321 Adjustment disorder with depressed mood: Secondary | ICD-10-CM | POA: Diagnosis not present

## 2021-04-22 NOTE — Progress Notes (Signed)
[place] Counselor/Therapist Progress Note  Patient ID: Wendy Pineda, MRN: [id number],    Date: 04/22/2021  Time Spent: 50 mins  Treatment Type: Individual Therapy  Reported Symptoms: Pt presents for today's session, via webex video, due to virus outbreak.  Pt shares that she is in her home with no one else present.  I shared with pt that I am in my office with no one else here with me either.     Mental Status Exam: Appearance:  Casual     Behavior: Appropriate  Motor: Normal  Speech/Language:  Clear and Coherent  Affect: Appropriate  Mood: normal  Thought process: normal  Thought content:   WNL  Sensory/Perceptual disturbances:   WNL  Orientation: oriented to person, place, and time/date  Attention: Good  Concentration: Good  Memory: WNL  Fund of knowledge:  Good  Insight:   Good  Judgment:  Good  Impulse Control: Good   Risk Assessment: Danger to Self:  No Self-injurious Behavior: No Danger to Others: No Duty to Warn:no Physical Aggression / Violence:No  Access to Firearms a concern: No  Gang Involvement:No   Subjective: Pt shares that she has talked with a family friend who was able to suggest an attorney that she can meet with tomorrow for the initial visit; pt is hopeful that the attorney can advocate for her in her legal issues with her ex-husband.  Pt's next court date is Friday, 04/24/21.  Pt continues to talk with her new prayer group that she met via [employer] and pt enjoys that interaction.  Pt has not had an update on the sale of her rental home.  Pt shares that her sister had surgery yesterday and she is doing well in her recovery.  She has talked with her mom yesterday as well and she is doing well at this time.  Pt continues to work on her appeal of the court's decision to throw out her request for spousal support.  Pt shares that her son got a loan to help her with the expenses for the attorney's appt tomorrow.  Pt is preparing a list of things  for her talk with the attorney about in tomorrow's mtg.  Pt continues to read her Bible daily and continues with her various prayer groups and she finds these activities to be helpful for her.  She continues to take her medication regularly as well.  Pt is hopeful that her rental house will sale soon because she can really use that money right now.  Encouraged pt to continue with her self care activities and we will meet next week for a follow up session.    Interventions: Cognitive Behavioral Therapy  Diagnosis:Adjustment disorder with depressed mood  Plan: Treatment Plan Strengths/Abilities:  Intelligent, Intuitive, Willing to participate in therapy Treatment Preferences:  Outpatient Individual Therapy Statement of Needs:  Patient is to use CBT, mindfulness and coping skills to help manage and/or decrease symptoms associated with their diagnosis. Symptoms:  Depressed/Irritable mood, worry, social withdrawal Problems Addressed:  Depressive thoughts, Sadness, Sleep issues, etc. Long Term Goals:  Pt to reduce overall level, frequency, and intensity of the feelings of depression as evidenced by decreased irritability, negative self talk, and helpless feelings from 6 to 7 days/week to 0 to 1 days/week, per client report, for at least 3 consecutive months.  Progress: 20% Short Term Goals:  Pt to verbally express understanding of the relationship between feelings of depression and their impact on thinking patterns and behaviors.  Pt to  verbalize an understanding of the role that distorted thinking plays in creating fears, excessive worry, and ruminations.  Progress: 20% Target Date:  03/19/2022 Frequency:  Weekly Modality:  Cognitive Behavioral Therapy Interventions by Therapist:  Therapist will use CBT, Mindfulness exercises, Coping skills and Referrals, as needed by client.   Ivan Anchors, [facility]

## 2021-04-30 ENCOUNTER — Ambulatory Visit (INDEPENDENT_AMBULATORY_CARE_PROVIDER_SITE_OTHER): Payer: No Typology Code available for payment source | Admitting: Psychology

## 2021-04-30 DIAGNOSIS — F4321 Adjustment disorder with depressed mood: Secondary | ICD-10-CM | POA: Diagnosis not present

## 2021-04-30 NOTE — Progress Notes (Signed)
[place] Counselor/Therapist Progress Note  Patient ID: Wendy Pineda, MRN: [id number],    Date: 04/30/2021  Time Spent: 50 mins  Treatment Type: Individual Therapy  Reported Symptoms: Pt presents for today's session, via webex video, due to virus outbreak.  Pt shares that she is in her sister's home with no one else present.  I shared with pt that I am in my office with no one else here with me either.     Mental Status Exam: Appearance:  Casual     Behavior: Appropriate  Motor: Normal  Speech/Language:  Clear and Coherent  Affect: Appropriate  Mood: normal  Thought process: normal  Thought content:   WNL  Sensory/Perceptual disturbances:   WNL  Orientation: oriented to person, place, and time/date  Attention: Good  Concentration: Good  Memory: WNL  Fund of knowledge:  Good  Insight:   Good  Judgment:  Good  Impulse Control: Good   Risk Assessment: Danger to Self:  No Self-injurious Behavior: No Danger to Others: No Duty to Warn:no Physical Aggression / Violence:No  Access to Firearms a concern: No  Gang Involvement:No   Subjective: Pt shares that she has turned in the "Notice of Appeal" and a part of the appeal paperwork.  She is continuing to work on the process.  Pt went to court on Friday of last week and shares that the judge sent pt and her husband's attorney into a room to try to work their issues out.  Pt shares that the attorney was acting ugly to pt during this conversation.  Pt shares that, "I stood up for myself and told the baliff that she was mistreating me and I was not going to be treated that way."  Pt shares the judge still did not sign pt's motion to compel her husband to give the court all of his financial documents.  Pt continues to be frustrated by the lack of support she feels from the court system; "it shouldn't be this way."  Pt still has not accepted an offer on the sale of her rental house.  Pt shares that she is waiting for her PCP  office to send info to the [facility] for her LTD claim.  Pt also met another attorney last Thursday before her court date last Friday; he told her he could not take her case either but he did make a couple of suggestions for pt.  Pt feels a significant amount of stress related to her current court case.  Pt shares that her sister and her mom are both continuing to be supportive of pt.  Pt shares that she got her hair done last week, she continues to read her Bible and bought herself a new book, continues to visit with her sister and her mom in [place], etc.  Encouraged pt to continue with her self care activities and we will meet in 2 wks for a follow up session.  Interventions: Cognitive Behavioral Therapy  Diagnosis:Adjustment disorder with depressed mood  Plan: Treatment Plan Strengths/Abilities:  Intelligent, Intuitive, Willing to participate in therapy Treatment Preferences:  Outpatient Individual Therapy Statement of Needs:  Patient is to use CBT, mindfulness and coping skills to help manage and/or decrease symptoms associated with their diagnosis. Symptoms:  Depressed/Irritable mood, worry, social withdrawal Problems Addressed:  Depressive thoughts, Sadness, Sleep issues, etc. Long Term Goals:  Pt to reduce overall level, frequency, and intensity of the feelings of depression as evidenced by decreased irritability, negative self talk, and helpless  6 to 7 days/week to 0 to 1 days/week, per client report, for at least 3 consecutive months.  Progress: 20% °Short Term Goals:  Pt to verbally express understanding of the relationship between feelings of depression and their impact on thinking patterns and behaviors.  Pt to verbalize an understanding of the role that distorted thinking plays in creating fears, excessive worry, and ruminations.  Progress: 20% °Target Date:  03/19/2022 °Frequency:  Weekly °Modality:  Cognitive Behavioral Therapy °Interventions by Therapist:  Therapist will use  CBT, Mindfulness exercises, Coping skills and Referrals, as needed by client. ° ° °Keith B Cottle, LCMHC °

## 2021-05-13 ENCOUNTER — Ambulatory Visit (INDEPENDENT_AMBULATORY_CARE_PROVIDER_SITE_OTHER): Payer: No Typology Code available for payment source | Admitting: Psychology

## 2021-05-13 DIAGNOSIS — F4321 Adjustment disorder with depressed mood: Secondary | ICD-10-CM | POA: Diagnosis not present

## 2021-05-13 NOTE — Progress Notes (Signed)
[place] Counselor/Therapist Progress Note  Patient ID: Wendy Pineda, MRN: [id number],    Date: 05/13/2021  Time Spent: 50 mins  Treatment Type: Individual Therapy  Reported Symptoms: Pt presents for today's session, via webex video, due to virus outbreak.  Pt shares that she is in her sister's home with no one else present.  I shared with pt that I am in my office with no one else here with me either.     Mental Status Exam: Appearance:  Casual     Behavior: Appropriate  Motor: Normal  Speech/Language:  Clear and Coherent  Affect: Appropriate  Mood: normal  Thought process: normal  Thought content:   WNL  Sensory/Perceptual disturbances:   WNL  Orientation: oriented to person, place, and time/date  Attention: Good  Concentration: Good  Memory: WNL  Fund of knowledge:  Good  Insight:   Good  Judgment:  Good  Impulse Control: Good   Risk Assessment: Danger to Self:  No Self-injurious Behavior: No Danger to Others: No Duty to Warn:no Physical Aggression / Violence:No  Access to Firearms a concern: No  Gang Involvement:No   Subjective: Pt shares that she has been trying to set limits and boundaries with her friends who have not been supportive of her while going through her separation and divorce.  Pt shares she talked with her supervisor earlier this week and her supervisor was just checking in to see how pt is doing.  Pt also advises that she has heard from the [facility] that her request for LTD has been approved.  She said there is one more piece of paperwork that she needs to complete and send back to them.  Pt is pleased that she has been approved for disability.  Pt shares she continues to try to find her way through the court system on her own.  She continues to be frustrated by the lack of support that she facing within the system.  She has not had another court date since our last session.  Pt is still waiting on a good offer for her rental home.  Pt  shares that she is staying in contact with her sister and her mom and they are continuing to support pt in this difficult time.  Encouraged pt to try to get out of the house today because the weather is so nice today.  She also went with a friend to the friend's daughter's basketball game last night in [place], [place].  She enjoyed being with her friend for the evening.  Encouraged pt to continue with her self care activities and we will meet in 2 wks for a follow up session.  Interventions: Cognitive Behavioral Therapy  Diagnosis:Adjustment disorder with depressed mood  Plan: Treatment Plan Strengths/Abilities:  Intelligent, Intuitive, Willing to participate in therapy Treatment Preferences:  Outpatient Individual Therapy Statement of Needs:  Patient is to use CBT, mindfulness and coping skills to help manage and/or decrease symptoms associated with their diagnosis. Symptoms:  Depressed/Irritable mood, worry, social withdrawal Problems Addressed:  Depressive thoughts, Sadness, Sleep issues, etc. Long Term Goals:  Pt to reduce overall level, frequency, and intensity of the feelings of depression as evidenced by decreased irritability, negative self talk, and helpless feelings from 6 to 7 days/week to 0 to 1 days/week, per client report, for at least 3 consecutive months.  Progress: 20% Short Term Goals:  Pt to verbally express understanding of the relationship between feelings of depression and their impact on thinking patterns and behaviors.  Pt to verbalize an understanding of the role that distorted thinking plays in creating fears, excessive worry, and ruminations.  Progress: 20% Target Date:  03/19/2022 Frequency:  Weekly Modality:  Cognitive Behavioral Therapy Interventions by Therapist:  Therapist will use CBT, Mindfulness exercises, Coping skills and Referrals, as needed by client.   Ivan Anchors, [facility]

## 2021-05-28 ENCOUNTER — Telehealth: Payer: Self-pay | Admitting: Nurse Practitioner

## 2021-05-28 ENCOUNTER — Ambulatory Visit (INDEPENDENT_AMBULATORY_CARE_PROVIDER_SITE_OTHER): Payer: No Typology Code available for payment source | Admitting: Psychology

## 2021-05-28 DIAGNOSIS — F4321 Adjustment disorder with depressed mood: Secondary | ICD-10-CM | POA: Diagnosis not present

## 2021-05-28 NOTE — Progress Notes (Signed)
[place] Counselor/Therapist Progress Note  Patient ID: Wendy Pineda, MRN: [id number],    Date: 05/28/2021  Time Spent: 50 mins  Treatment Type: Individual Therapy  Reported Symptoms: Pt presents for today's session, via webex video, due to virus outbreak.  Pt shares that she is in her home with no one else present.  I shared with pt that I am in my office at home with no one else here with me either.     Mental Status Exam: Appearance:  Casual     Behavior: Appropriate  Motor: Normal  Speech/Language:  Clear and Coherent  Affect: Appropriate  Mood: normal  Thought process: normal  Thought content:   WNL  Sensory/Perceptual disturbances:   WNL  Orientation: oriented to person, place, and time/date  Attention: Good  Concentration: Good  Memory: WNL  Fund of knowledge:  Good  Insight:   Good  Judgment:  Good  Impulse Control: Good   Risk Assessment: Danger to Self:  No Self-injurious Behavior: No Danger to Others: No Duty to Warn:no Physical Aggression / Violence:No  Access to Firearms a concern: No  Gang Involvement:No   Subjective: Pt shares that she has been fighting a cold since last week and she has not been feeling well.  Pt also shares she went to court with her friend Building services engineer) that she met in the courthouse and who has been helpful to pt in the process.  Pt has met several other women who are in the same situation as pt and Wendy Pineda, having no attorney to represent herself.  Pt feels bad for all of these women and feels less isolated, but still understands that she has her own situation that she has to continue to work through.  Pt shares she continues to work on legal documents for her own case.  She has a calendar call on 3/20 as the next date for her.  She and her husband separated in 2019; "I can't believe I am still dealing with this stuff for 4 years."  Pt shares that her sone, Wendy Pineda, sent her a lovely Valentine's Day card and she appreciated it.  Pt  shares she continues to engage in self care activities: her son bought her a firepit for her back yard and she appreciates that as well.  She also sat outside some yesterday and plans to do that again today.  Pt shares that she has been getting payments from the [facility] for her LTD and that money has been helpful for pt.  Encouraged pt to work on cleaning her home as she is able; make progress as she can and take breaks during the process.  Pt shares that her sister has asked pt what she wants to do for her birthday in April; they are considering going to a winery in [place].  Pt is still attempting to sell her rental home but there is not update right now.  Encouraged pt to continue with her self care activities and we will meet in 2 wks for a follow up session.   Interventions: Cognitive Behavioral Therapy  Diagnosis:Adjustment disorder with depressed mood  Plan: Treatment Plan Strengths/Abilities:  Intelligent, Intuitive, Willing to participate in therapy Treatment Preferences:  Outpatient Individual Therapy Statement of Needs:  Patient is to use CBT, mindfulness and coping skills to help manage and/or decrease symptoms associated with their diagnosis. Symptoms:  Depressed/Irritable mood, worry, social withdrawal Problems Addressed:  Depressive thoughts, Sadness, Sleep issues, etc. Long Term Goals:  Pt to reduce overall  level, frequency, and intensity of the feelings of depression as evidenced by decreased irritability, negative self talk, and helpless feelings from 6 to 7 days/week to 0 to 1 days/week, per client report, for at least 3 consecutive months.  Progress: 20% Short Term Goals:  Pt to verbally express understanding of the relationship between feelings of depression and their impact on thinking patterns and behaviors.  Pt to verbalize an understanding of the role that distorted thinking plays in creating fears, excessive worry, and ruminations.  Progress: 20% Target Date:   03/19/2022 Frequency:  Weekly Modality:  Cognitive Behavioral Therapy Interventions by Therapist:  Therapist will use CBT, Mindfulness exercises, Coping skills and Referrals, as needed by client.   Ivan Anchors, [facility]

## 2021-05-28 NOTE — Telephone Encounter (Signed)
Called patient to make aware I have sent a message to her counselor to see what his recommendations are for being out of work. She also reports having cold symptoms, advised to take a covid test and if not better over the weekend to call back or go to urgent care. She can take theraflu or oscicocillinium

## 2021-06-02 ENCOUNTER — Other Ambulatory Visit: Payer: Self-pay

## 2021-06-02 MED ORDER — ESCITALOPRAM OXALATE 10 MG PO TABS: 10.0000 mg | ORAL_TABLET | Freq: Every day | ORAL | 1 refills | Status: DC

## 2021-06-09 ENCOUNTER — Telehealth: Payer: Self-pay

## 2021-06-09 NOTE — Telephone Encounter (Signed)
Patient notified that her Laurance Flatten, DNP, FNP=BC said her [employer] forms would be completed and faxed tomorrow.  ?

## 2021-06-10 ENCOUNTER — Ambulatory Visit (INDEPENDENT_AMBULATORY_CARE_PROVIDER_SITE_OTHER): Payer: No Typology Code available for payment source | Admitting: Psychology

## 2021-06-10 DIAGNOSIS — F4321 Adjustment disorder with depressed mood: Secondary | ICD-10-CM

## 2021-06-10 NOTE — Progress Notes (Signed)
[facility] Counselor/Therapist Progress Note ? ?Patient ID: FLOY ANGERT, MRN: [id number],   ? ?Date: 06/10/2021 ? ?Time Spent: 50 mins ? ?Treatment Type: Individual Therapy ? ?Reported Symptoms: Pt presents for today's session, via webex video, due to virus outbreak.  Pt shares that she is in her car with no one else present.  I shared with pt that I am in my office at home with no one else here with me either.   ? ? ?Mental Status Exam: ?Appearance:  Casual     ?Behavior: Appropriate  ?Motor: Normal  ?Speech/Language:  Clear and Coherent  ?Affect: Appropriate  ?Mood: normal  ?Thought process: normal  ?Thought content:   WNL  ?Sensory/Perceptual disturbances:   WNL  ?Orientation: oriented to person, place, and time/date  ?Attention: Good  ?Concentration: Good  ?Memory: WNL  ?Fund of knowledge:  Good  ?Insight:   Good  ?Judgment:  Good  ?Impulse Control: Good  ? ?Risk Assessment: ?Danger to Self:  No ?Self-injurious Behavior: No ?Danger to Others: No ?Duty to Warn:no ?Physical Aggression / Violence:No  ?Access to Firearms a concern: No  ?Gang Involvement:No  ? ?Subjective: Pt shares that she "had an episode last week where I was coming down the stairs and I blanked out.  I had not been sleeping well.  I fell down four steps.  My knee and my shoulder were sore."  Pt did not see her PCP after this event.  She has not had any other episodes like this in the past or since.  Encouraged pt to work on having a regular going to bed routine in spite of having all of these legal issues going on for her.  Pt shares that she went to a friend's home Sunday for Brunch with a group of ladies; pt enjoyed that interaction with friends.  Pt has also been walking recently and has also been talking with her mom regularly; her mom continues to be supportive.  She is also continuing to stay connected to her prayer group and they are being supportive of pt.  Pt has until this Friday to complete a document for the courts.  She  continues to work on these issues by herself, without legal representation.  Pt shares that she really needs to get this done by this Friday.  Pt shares that she continues to receive money from the [employer] and is catching up on her bills.  Pt shares she has gotten an offer on rental property and she has accepted it and the sale has closed and she has her money already.  She is hopeful to use some of that money to get an attorney to represent her the rest of the way through her settlement process.  Encouraged pt to continue with her self care activities and we will meet next week for a follow up session.  ? ?Interventions: Cognitive Behavioral Therapy ? ?Diagnosis:Adjustment disorder with depressed mood ? ?Plan: Treatment Plan ?Strengths/Abilities:  Intelligent, Intuitive, Willing to participate in therapy ?Treatment Preferences:  Outpatient Individual Therapy ?Statement of Needs:  Patient is to use CBT, mindfulness and coping skills to help manage and/or decrease symptoms associated with their diagnosis. ?Symptoms:  Depressed/Irritable mood, worry, social withdrawal ?Problems Addressed:  Depressive thoughts, Sadness, Sleep issues, etc. ?Long Term Goals:  Pt to reduce overall level, frequency, and intensity of the feelings of depression as evidenced by decreased irritability, negative self talk, and helpless feelings from 6 to 7 days/week to 0 to 1 days/week, per client report,  for at least 3 consecutive months.  Progress: 20% ?Short Term Goals:  Pt to verbally express understanding of the relationship between feelings of depression and their impact on thinking patterns and behaviors.  Pt to verbalize an understanding of the role that distorted thinking plays in creating fears, excessive worry, and ruminations.  Progress: 20% ?Target Date:  03/19/2022 ?Frequency:  Weekly ?Modality:  Cognitive Behavioral Therapy ?Interventions by Therapist:  Therapist will use CBT, Mindfulness exercises, Coping skills and Referrals,  as needed by client. ? ? ?Ivan Anchors, [facility] ?

## 2021-06-17 ENCOUNTER — Ambulatory Visit (INDEPENDENT_AMBULATORY_CARE_PROVIDER_SITE_OTHER): Payer: No Typology Code available for payment source | Admitting: Psychology

## 2021-06-17 DIAGNOSIS — F4321 Adjustment disorder with depressed mood: Secondary | ICD-10-CM

## 2021-06-17 NOTE — Progress Notes (Signed)
[facility] Counselor/Therapist Progress Note ? ?Patient ID: Wendy Pineda, MRN: [id number],   ? ?Date: 06/17/2021 ? ?Time Spent: 50 mins ? ?Treatment Type: Individual Therapy ? ?Reported Symptoms: Pt presents for today's session, via webex video, due to virus outbreak.  Pt shares that she is in her car with no one else present.  I shared with pt that I am in my office at home with no one else here with me either.   ? ? ?Mental Status Exam: ?Appearance:  Casual     ?Behavior: Appropriate  ?Motor: Normal  ?Speech/Language:  Clear and Coherent  ?Affect: Appropriate  ?Mood: normal  ?Thought process: normal  ?Thought content:   WNL  ?Sensory/Perceptual disturbances:   WNL  ?Orientation: oriented to person, place, and time/date  ?Attention: Good  ?Concentration: Good  ?Memory: WNL  ?Fund of knowledge:  Good  ?Insight:   Good  ?Judgment:  Good  ?Impulse Control: Good  ? ?Risk Assessment: ?Danger to Self:  No ?Self-injurious Behavior: No ?Danger to Others: No ?Duty to Warn:no ?Physical Aggression / Violence:No  ?Access to Firearms a concern: No  ?Gang Involvement:No  ? ?Subjective: Pt shares that "the last week has been different.  I had dinner in [place] last Wednesday for my uncle's birthday.  It was a nice time with family.  I was able to get to the restaurant before some other family members and that is unusual and that felt good."  Pt shares she has not been sleeping very well in the past week.  Pt shares she fell asleep last night about 10pm while talking with a friend.  She also shares that she woke up at 250am this morning and has been awake since then.  She continues to struggle with the legal paperwork that she has to complete for her case.  Pt shares she has lost some weight because her clothes are fitting differently now.  Encouraged pt to make an appt to see her PCP so that she can be sure she continues to be healthy.  Pt shares that her supervisor from work called her and left a message for her  yesterday.  Pt did not take the call because she "was not in a good place to talk with her."  Pt is also stressed about an issue with her car loan that she believes she had paid in full; the lender is saying her payments were late.  Pt shares her next court date is Monday; they have not done any mediation as well.  We will meet next week for a follow up session.   ? ?Interventions: Cognitive Behavioral Therapy ? ?Diagnosis:Adjustment disorder with depressed mood ? ?Plan: Treatment Plan ?Strengths/Abilities:  Intelligent, Intuitive, Willing to participate in therapy ?Treatment Preferences:  Outpatient Individual Therapy ?Statement of Needs:  Patient is to use CBT, mindfulness and coping skills to help manage and/or decrease symptoms associated with their diagnosis. ?Symptoms:  Depressed/Irritable mood, worry, social withdrawal ?Problems Addressed:  Depressive thoughts, Sadness, Sleep issues, etc. ?Long Term Goals:  Pt to reduce overall level, frequency, and intensity of the feelings of depression as evidenced by decreased irritability, negative self talk, and helpless feelings from 6 to 7 days/week to 0 to 1 days/week, per client report, for at least 3 consecutive months.  Progress: 20% ?Short Term Goals:  Pt to verbally express understanding of the relationship between feelings of depression and their impact on thinking patterns and behaviors.  Pt to verbalize an understanding of the role that distorted thinking  plays in creating fears, excessive worry, and ruminations.  Progress: 20% ?Target Date:  03/19/2022 ?Frequency:  Weekly ?Modality:  Cognitive Behavioral Therapy ?Interventions by Therapist:  Therapist will use CBT, Mindfulness exercises, Coping skills and Referrals, as needed by client. ? ? ?Ivan Anchors, [facility] ?

## 2021-06-18 ENCOUNTER — Encounter: Payer: Self-pay | Admitting: Nurse Practitioner

## 2021-06-18 ENCOUNTER — Ambulatory Visit (INDEPENDENT_AMBULATORY_CARE_PROVIDER_SITE_OTHER): Payer: No Typology Code available for payment source | Admitting: Nurse Practitioner

## 2021-06-18 ENCOUNTER — Other Ambulatory Visit: Payer: Self-pay

## 2021-06-18 VITALS — BP 112/80 | HR 77 | Temp 98.6°F | Ht 66.4 in | Wt 171.6 lb

## 2021-06-18 DIAGNOSIS — R634 Abnormal weight loss: Secondary | ICD-10-CM

## 2021-06-18 DIAGNOSIS — Z2821 Immunization not carried out because of patient refusal: Secondary | ICD-10-CM

## 2021-06-18 DIAGNOSIS — F419 Anxiety disorder, unspecified: Secondary | ICD-10-CM | POA: Diagnosis not present

## 2021-06-18 DIAGNOSIS — R55 Syncope and collapse: Secondary | ICD-10-CM

## 2021-06-18 DIAGNOSIS — F32A Depression, unspecified: Secondary | ICD-10-CM

## 2021-06-18 DIAGNOSIS — R079 Chest pain, unspecified: Secondary | ICD-10-CM

## 2021-06-18 DIAGNOSIS — E559 Vitamin D deficiency, unspecified: Secondary | ICD-10-CM

## 2021-06-18 DIAGNOSIS — E7800 Pure hypercholesterolemia, unspecified: Secondary | ICD-10-CM

## 2021-06-18 NOTE — Progress Notes (Signed)
?Judithann Sauger [place],acting as a Education administrator for Minette Brine, FNP.,have documented all relevant documentation on the behalf of Minette Brine, FNP,as directed by  Minette Brine, FNP while in the presence of Minette Brine, [place].  ? ?This visit occurred during the SARS-CoV-2 public health emergency.  Safety protocols were in place, including screening questions prior to the visit, additional usage of staff PPE, and extensive cleaning of exam room while observing appropriate contact time as indicated for disinfecting solutions. ? ?Subjective:  ?  ? Patient ID: Wendy Pineda , female    DOB: 1965/05/26 , 56 y.o.   MRN: [id number] ? ? ?Chief Complaint  ?Patient presents with  ? passed out  ? ? ?HPI ? ?Patient presents today because she passed out on Thursday. She was walking down the stairs and coming down the last part look like a light blue curtain went across her face and hit her left knee and left shoulder. The last thing she remembered was something light blue. She is unaware of how long. She did not go to the urgent care or ER. Did not want to drive. She had not been sleeping and she is unsure of how long it had been. She would sleep 2-3 hours. She just rested on the couch.   ? ?She reports she has been having a lot of stress. She also complains of chest pain and being unable to sleep. She reports she passed out while going on the stairs and missed the last step. She is also concerned about losing weight. She feels like she has been having unexplained. Last week she had tingling to her right hand fingertips. Denies blood in stool or urine. She had loose stools last week.  ? ?She is taking supplements - skin, hair and nails, zipfizz, immune support packet.  ?Continues with therapy no changes.  ? ?Wt Readings from Last 3 Encounters: ?06/18/21 : 171 lb 9.6 oz (77.8 kg) ?03/31/21 : 183 lb (83 kg) ?03/17/21 : 183 lb (83 kg) ? ?She had her colonoscopy March 31, 2021 at [facility].  ?  ?  ? ?Past Medical History:  ?Diagnosis Date   ? Anemia   ? Anxiety   ? on meds  ? Arthritis   ? bilateral knees  ? Carotid bruit   ? carotid dopplers 12/22/2010, no hemodynamically significant stenosis  ? Depression   ? on meds  ? GERD (gastroesophageal reflux disease)   ? occasional-tums prn-overindulgence  ? Heart murmur   ? echo 10/11/2007 EF >55%mild, MR  ? Hx of chest pain   ? neg. lexiscan myoview 02/23/2011  ? Hyperlipidemia   ? diet controlled  ? Sickle cell trait ([place])   ?  ? ?Family History  ?Problem Relation Age of Onset  ? Colon polyps Mother 74  ? Breast cancer Mother   ? Stroke Maternal Grandmother   ? Colon polyps Maternal Grandfather 39  ? Colon cancer Maternal Grandfather 72  ? Esophageal cancer Neg Hx   ? Rectal cancer Neg Hx   ? Stomach cancer Neg Hx   ? ? ? ?Current Outpatient Medications:  ?  Ascorbic Acid (VITAMIN C PO), Take 1 tablet by mouth daily at 12 noon., Disp: , Rfl:  ?  B Complex Vitamins (B COMPLEX PO), Take 1 packet by mouth daily. Reported on 06/27/2015, Disp: , Rfl:  ?  COLLAGEN PO, Take 1 Scoop by mouth every other day., Disp: , Rfl:  ?  escitalopram (LEXAPRO) 10 MG tablet, Take 1 tablet (10  mg total) by mouth daily., Disp: 30 tablet, Rfl: 1 ?  Magnesium 250 MG TABS, Take 1 tablet (250 mg total) by mouth every evening., Disp: 90 tablet, Rfl: 0 ?  Multiple Vitamin (MULTIVITAMIN PO), Take 1 tablet by mouth daily at 2 am., Disp: , Rfl:  ?  VITAMIN D PO, Take 1 tablet by mouth daily., Disp: , Rfl:  ?  Vitamin D, Ergocalciferol, (DRISDOL) 1.25 MG (50000 UNIT) CAPS capsule, Take 50,000 Units by mouth once a week., Disp: , Rfl:  ?  vortioxetine HBr (TRINTELLIX) 5 MG TABS tablet, Take 5 mg by mouth daily., Disp: , Rfl:  ?  Multiple Vitamins-Minerals (ZINC PO), Take 1 tablet by mouth daily at 12 noon. (Patient not taking: Reported on 03/17/2021), Disp: , Rfl:   ? ?Allergies  ?Allergen Reactions  ? Sulfa Antibiotics Hives, Swelling and Rash  ? Elemental Sulfur Hives, Swelling and Rash  ?  ? ?Review of Systems  ?Constitutional: Negative.    ?Respiratory:  Positive for shortness of breath.   ?Cardiovascular:  Positive for chest pain.  ?Neurological:  Negative for dizziness and headaches (one day last week had a headache).  ?     Reports she did not feel herself.   ?Psychiatric/Behavioral: Negative.     ? ?Today's Vitals  ? 06/18/21 1518  ?BP: 112/80  ?Pulse: 77  ?Temp: 98.6 ?F (37 ?C)  ?Weight: 171 lb 9.6 oz (77.8 kg)  ?Height: 5' 6.4" (1.687 m)  ? ?Body mass index is 27.36 kg/m?.  ? ?Objective:  ?Physical Exam ?Vitals reviewed.  ?Constitutional:   ?   General: She is not in acute distress. ?   Appearance: Normal appearance.  ?Cardiovascular:  ?   Rate and Rhythm: Normal rate and regular rhythm.  ?   Pulses: Normal pulses.  ?   Heart sounds: Normal heart sounds. No murmur heard. ?Pulmonary:  ?   Effort: Pulmonary effort is normal. No respiratory distress.  ?   Breath sounds: Normal breath sounds.  ?Neurological:  ?   Mental Status: She is alert.  ?Psychiatric:     ?   Mood and Affect: Mood normal.     ?   Behavior: Behavior normal.     ?   Thought Content: Thought content normal.     ?   Judgment: Judgment normal.  ?  ? ?   ?Assessment And Plan:  ?   ?1. Abnormal weight loss ?Comments: Weight is down 12 lbs since December, will check for metabolic causes. Also consider the increased stress she has been under.  ? ?2. Syncope, unspecified syncope type ?Comments: No previous history of syncope, will check for metabolic cause and advised in the future needs to seek medical attention immediately  ?- CT HEAD WO CONTRAST (5MM); Future ?- CBC with Differential/Platelet ?- CMP14+EGFR ?- TSH ? ?3. Anxiety and depression ?Comments: Continues to go to therapist weekly ? ?4. Chest pain, unspecified type ?Comments: EKG done with NSR HR 68. No abnormal findings on physical exam.  ?- EKG 12-Lead ? ?5. Hypercholesteremia ?Comments: Stable, continue diet controlled.  ?- Lipid panel ? ?6. Vitamin D deficiency ?Will check vitamin D level and supplement as needed.    ?Also  encouraged to spend 15 minutes in the sun daily.  ?- VITAMIN D 25 Hydroxy (Vit-D Deficiency, Fractures) ? ?7. Herpes zoster vaccination declined ? ?8. COVID-19 vaccination declined ?Declines covid 19 vaccine. Discussed risk of covid 60 and if she changes her mind about the vaccine to call the  office.  Encouraged to take multivitamin, vitamin d, vitamin c and zinc to increase immune system. Aware can call office if would like to have vaccine here at office.  ? ? ? ?Patient was given opportunity to ask questions. Patient verbalized understanding of the plan and was able to repeat key elements of the plan. All questions were answered to their satisfaction.  ?Minette Brine, FNP  ? ?I, Minette Brine, FNP, have reviewed all documentation for this visit. The documentation on 06/18/21 for the exam, diagnosis, procedures, and orders are all accurate and complete.  ? ?IF YOU HAVE BEEN REFERRED TO A SPECIALIST, IT MAY TAKE 1-2 WEEKS TO SCHEDULE/PROCESS THE REFERRAL. IF YOU HAVE NOT HEARD FROM US/SPECIALIST IN TWO WEEKS, PLEASE GIVE [place] A CALL AT [phone].  ? ?THE PATIENT IS ENCOURAGED TO PRACTICE SOCIAL DISTANCING DUE TO THE COVID-19 PANDEMIC.   ?

## 2021-06-19 LAB — CBC WITH DIFFERENTIAL/PLATELET
Basophils Absolute: 0.1 10*3/uL (ref 0.0–0.2)
Basos: 1 %
EOS (ABSOLUTE): 0.1 10*3/uL (ref 0.0–0.4)
Eos: 2 %
Hematocrit: 40.7 % (ref 34.0–46.6)
Hemoglobin: 13.8 g/dL (ref 11.1–15.9)
Immature Grans (Abs): 0 10*3/uL (ref 0.0–0.1)
Immature Granulocytes: 0 %
Lymphocytes Absolute: 2.1 10*3/uL (ref 0.7–3.1)
Lymphs: 35 %
MCH: 27.3 pg (ref 26.6–33.0)
MCHC: 33.9 g/dL (ref 31.5–35.7)
MCV: 81 fL (ref 79–97)
Monocytes Absolute: 0.7 10*3/uL (ref 0.1–0.9)
Monocytes: 12 %
Neutrophils Absolute: 3.1 10*3/uL (ref 1.4–7.0)
Neutrophils: 50 %
Platelets: 359 10*3/uL (ref 150–450)
RBC: 5.05 x10E6/uL (ref 3.77–5.28)
RDW: 13.3 % (ref 11.7–15.4)
WBC: 6.2 10*3/uL (ref 3.4–10.8)

## 2021-06-19 LAB — LIPID PANEL
Chol/HDL Ratio: 3.5 ratio (ref 0.0–4.4)
Cholesterol, Total: 269 mg/dL — ABNORMAL HIGH (ref 100–199)
HDL: 77 mg/dL (ref >39)
LDL Chol Calc (NIH): 177 mg/dL — ABNORMAL HIGH (ref 0–99)
Triglycerides: 89 mg/dL (ref 0–149)
VLDL Cholesterol Cal: 15 mg/dL (ref 5–40)

## 2021-06-19 LAB — CMP14+EGFR
ALT: 18 IU/L (ref 0–32)
AST: 13 IU/L (ref 0–40)
Albumin/Globulin Ratio: 1.8 (ref 1.2–2.2)
Albumin: 4.4 g/dL (ref 3.8–4.9)
Alkaline Phosphatase: 137 IU/L — ABNORMAL HIGH (ref 44–121)
BUN/Creatinine Ratio: 19 (ref 9–23)
BUN: 14 mg/dL (ref 6–24)
Bilirubin Total: <0.2 mg/dL (ref 0.0–1.2)
CO2: 24 mmol/L (ref 20–29)
Calcium: 9 mg/dL (ref 8.7–10.2)
Chloride: 106 mmol/L (ref 96–106)
Creatinine, Ser: 0.75 mg/dL (ref 0.57–1.00)
Globulin, Total: 2.4 g/dL (ref 1.5–4.5)
Glucose: 74 mg/dL (ref 70–99)
Potassium: 4.3 mmol/L (ref 3.5–5.2)
Sodium: 141 mmol/L (ref 134–144)
Total Protein: 6.8 g/dL (ref 6.0–8.5)
eGFR: 95 mL/min/{1.73_m2} (ref >59)

## 2021-06-19 LAB — TSH: TSH: 1.09 u[IU]/mL (ref 0.450–4.500)

## 2021-06-19 LAB — VITAMIN D 25 HYDROXY (VIT D DEFICIENCY, FRACTURES): Vit D, 25-Hydroxy: 29.6 ng/mL — ABNORMAL LOW (ref 30.0–100.0)

## 2021-06-24 ENCOUNTER — Ambulatory Visit (INDEPENDENT_AMBULATORY_CARE_PROVIDER_SITE_OTHER): Payer: No Typology Code available for payment source | Admitting: Psychology

## 2021-06-24 DIAGNOSIS — F4321 Adjustment disorder with depressed mood: Secondary | ICD-10-CM | POA: Diagnosis not present

## 2021-06-24 NOTE — Progress Notes (Signed)
[facility] Counselor/Therapist Progress Note ? ?Patient ID: Wendy Pineda, MRN: [id number],   ? ?Date: 06/24/2021 ? ?Time Spent: 50 mins ? ?Treatment Type: Individual Therapy ? ?Reported Symptoms: Pt presents for today's session, via webex video, due to virus outbreak.  Pt shares that she is in her home with no one else present.  I shared with pt that I am in my office at home with no one else here with me either.   ? ? ?Mental Status Exam: ?Appearance:  Casual     ?Behavior: Appropriate  ?Motor: Normal  ?Speech/Language:  Clear and Coherent  ?Affect: Appropriate  ?Mood: normal  ?Thought process: normal  ?Thought content:   WNL  ?Sensory/Perceptual disturbances:   WNL  ?Orientation: oriented to person, place, and time/date  ?Attention: Good  ?Concentration: Good  ?Memory: WNL  ?Fund of knowledge:  Good  ?Insight:   Good  ?Judgment:  Good  ?Impulse Control: Good  ? ?Risk Assessment: ?Danger to Self:  No ?Self-injurious Behavior: No ?Danger to Others: No ?Duty to Warn:no ?Physical Aggression / Violence:No  ?Access to Firearms a concern: No  ?Gang Involvement:No  ? ?Subjective: Pt shares that she went to [facility] and attended her cousin's funeral last week.  She enjoyed seeing family members but also saw some unkind treatment of some family members by other family members.  Pt shares that she did sleep well while in [place] at her sister's home.  Pt was able to reconnect with a friend of hers from elementary school and enjoyed seeing her as well.  Her friend makes cakes and is a Regulatory affairs officer; a very talented person.  This friend also shared the name of an attorney friend of hers who may be able to help pt.  Friend asked pt to give her a few days to reach out to the attorney before pt calls the attorney.  Pt also learned that a college friend of her's passed away.  He had a nonprofit organized to help encouraged young black men to read.  Pt realizes she enjoys reading but very rarely allows herself time to read  for pleasure.  Encouraged pt to allow herself at least 30 mins per day to just sit with herself and enjoy reading.  Will follow up with pt on this issue at our next session.  Pt shares that she is having a CT scan next week in [place] because she went to her PCP about pt having passed out a couple of weeks ago while coming down her steps at home.  Pt's PCP just wants to make sure she is OK after that fall.  Pt shares that she had to go back to court on Monday to set the schedule for proceedings to finish up the property settlement part of her divorce; the pre-trial activity will be in April and she believes the trial will be in April as well.  Pt continues to try to locate an attorney to represent her in these matters.  Pt shares she has not talked with her supervisor from work but she has left a message for her; the supervisor has not yet called her back and that is OK with pt.  Encouraged pt to continue with her self care activities and we will meet in 2 wks for a follow up session. ? ?Interventions: Cognitive Behavioral Therapy ? ?Diagnosis:Adjustment disorder with depressed mood ? ?Plan: Treatment Plan ?Strengths/Abilities:  Intelligent, Intuitive, Willing to participate in therapy ?Treatment Preferences:  Outpatient Individual Therapy ?Statement of Needs:  Patient is to use CBT, mindfulness and coping skills to help manage and/or decrease symptoms associated with their diagnosis. ?Symptoms:  Depressed/Irritable mood, worry, social withdrawal ?Problems Addressed:  Depressive thoughts, Sadness, Sleep issues, etc. ?Long Term Goals:  Pt to reduce overall level, frequency, and intensity of the feelings of depression as evidenced by decreased irritability, negative self talk, and helpless feelings from 6 to 7 days/week to 0 to 1 days/week, per client report, for at least 3 consecutive months.  Progress: 20% ?Short Term Goals:  Pt to verbally express understanding of the relationship between feelings of depression and  their impact on thinking patterns and behaviors.  Pt to verbalize an understanding of the role that distorted thinking plays in creating fears, excessive worry, and ruminations.  Progress: 20% ?Target Date:  03/19/2022 ?Frequency:  Weekly ?Modality:  Cognitive Behavioral Therapy ?Interventions by Therapist:  Therapist will use CBT, Mindfulness exercises, Coping skills and Referrals, as needed by client. ? ? ?Ivan Anchors, [facility] ?

## 2021-06-30 ENCOUNTER — Ambulatory Visit
Admission: RE | Admit: 2021-06-30 | Discharge: 2021-06-30 | Disposition: A | Discharge status: Home / Self Care | Payer: No Typology Code available for payment source | Source: Ambulatory Visit | Attending: Nurse Practitioner | Admitting: Nurse Practitioner

## 2021-06-30 ENCOUNTER — Other Ambulatory Visit: Payer: Self-pay

## 2021-06-30 DIAGNOSIS — R55 Syncope and collapse: Secondary | ICD-10-CM

## 2021-07-10 ENCOUNTER — Ambulatory Visit (INDEPENDENT_AMBULATORY_CARE_PROVIDER_SITE_OTHER): Payer: No Typology Code available for payment source | Admitting: Psychology

## 2021-07-10 DIAGNOSIS — F4321 Adjustment disorder with depressed mood: Secondary | ICD-10-CM

## 2021-07-10 NOTE — Progress Notes (Signed)
[facility] Counselor/Therapist Progress Note ? ?Patient ID: Wendy Pineda, MRN: [id number],   ? ?Date: 07/10/2021 ? ?Time Spent: 50 mins ? ?Treatment Type: Individual Therapy ? ?Reported Symptoms: Pt presents for today's session, via webex video, due to virus outbreak.  Pt shares that she is in her home with no one else present.  I shared with pt that I am in my office at home with no one else here with me either.   ? ? ?Mental Status Exam: ?Appearance:  Casual     ?Behavior: Appropriate  ?Motor: Normal  ?Speech/Language:  Clear and Coherent  ?Affect: Appropriate  ?Mood: normal  ?Thought process: normal  ?Thought content:   WNL  ?Sensory/Perceptual disturbances:   WNL  ?Orientation: oriented to person, place, and time/date  ?Attention: Good  ?Concentration: Good  ?Memory: WNL  ?Fund of knowledge:  Good  ?Insight:   Good  ?Judgment:  Good  ?Impulse Control: Good  ? ?Risk Assessment: ?Danger to Self:  No ?Self-injurious Behavior: No ?Danger to Others: No ?Duty to Warn:no ?Physical Aggression / Violence:No  ?Access to Firearms a concern: No  ?Gang Involvement:No  ? ?Subjective: Pt shares that she went to spend some time with her mom earlier this week; her mom's birthday was yesterday (54 yo).  Her mom was very appreciative of pt spending time with her for her birthday.  Pt shares she slept well while she was in [place] with her mom.  Pt is very excited about having bought some plants for her garden and is looking forward to getting them into the ground.  Pt is trying to look forward in her life and not worry about what is going to happen with her property distribution.  "I am trying to have a positive outlook."  Pt continues to pray for direction in these matters.  Pt shares that the court proceedings are scheduled for next week to begin wrapping up their case.  She still does not have an attorney to represent her but has a couple of new leads on options.  Pt attended a prayer conference two weeks ago and  purchased three books while she was there; she enjoyed what she learned and experienced.  "I really enjoyed the conference."  She also bought the plants and flowers as a self care activity as well.  She has been trying to read regularly and is struggling to focus.  She has started journaling to help her with her focus as well.  Encouraged pt to continue with her self care activities and we will meet in 2 wks for a follow up session. ? ?Interventions: Cognitive Behavioral Therapy ? ?Diagnosis:Adjustment disorder with depressed mood ? ?Plan: Treatment Plan ?Strengths/Abilities:  Intelligent, Intuitive, Willing to participate in therapy ?Treatment Preferences:  Outpatient Individual Therapy ?Statement of Needs:  Patient is to use CBT, mindfulness and coping skills to help manage and/or decrease symptoms associated with their diagnosis. ?Symptoms:  Depressed/Irritable mood, worry, social withdrawal ?Problems Addressed:  Depressive thoughts, Sadness, Sleep issues, etc. ?Long Term Goals:  Pt to reduce overall level, frequency, and intensity of the feelings of depression as evidenced by decreased irritability, negative self talk, and helpless feelings from 6 to 7 days/week to 0 to 1 days/week, per client report, for at least 3 consecutive months.  Progress: 20% ?Short Term Goals:  Pt to verbally express understanding of the relationship between feelings of depression and their impact on thinking patterns and behaviors.  Pt to verbalize an understanding of the role that distorted  thinking plays in creating fears, excessive worry, and ruminations.  Progress: 20% ?Target Date:  03/19/2022 ?Frequency:  Weekly ?Modality:  Cognitive Behavioral Therapy ?Interventions by Therapist:  Therapist will use CBT, Mindfulness exercises, Coping skills and Referrals, as needed by client. ? ? ?Ivan Anchors, [facility] ?

## 2021-07-21 ENCOUNTER — Ambulatory Visit (INDEPENDENT_AMBULATORY_CARE_PROVIDER_SITE_OTHER): Payer: No Typology Code available for payment source | Admitting: Psychology

## 2021-07-21 DIAGNOSIS — F4321 Adjustment disorder with depressed mood: Secondary | ICD-10-CM | POA: Diagnosis not present

## 2021-07-21 NOTE — Progress Notes (Signed)
[place] Counselor/Therapist Progress Note ? ?Patient ID: Wendy Pineda, MRN: [id number],   ? ?Date: 07/21/2021 ? ?Time Spent: 45 mins ? ?Treatment Type: Individual Therapy ? ?Reported Symptoms: Pt presents for today's session, via webex video, due to virus outbreak.  Pt shares that she is in her home with no one else present.  I shared with pt that I am in my office with no one else here with me either.   ? ? ?Mental Status Exam: ?Appearance:  Casual     ?Behavior: Appropriate  ?Motor: Normal  ?Speech/Language:  Clear and Coherent  ?Affect: Appropriate  ?Mood: normal  ?Thought process: normal  ?Thought content:   WNL  ?Sensory/Perceptual disturbances:   WNL  ?Orientation: oriented to person, place, and time/date  ?Attention: Good  ?Concentration: Good  ?Memory: WNL  ?Fund of knowledge:  Good  ?Insight:   Good  ?Judgment:  Good  ?Impulse Control: Good  ? ?Risk Assessment: ?Danger to Self:  No ?Self-injurious Behavior: No ?Danger to Others: No ?Duty to Warn:no ?Physical Aggression / Violence:No  ?Access to Firearms a concern: No  ?Gang Involvement:No  ? ?Subjective: Pt shares that she had court yesterday and she requested an extension of time for her case.  Pt now has an attorney to represent her in this matter.  Pt prefers to deal with this issue via mediation.  She is hopeful that her new attorney will be able to take over all of this work for her and to protect her interests.  Her case is now scheduled to be heard in May.  Pt shares she mopped her kitchen floor and cleaned her kitchen on Sunday.  She has also been planting some of her flowers in pots as a means of self care activities.  Pt shares her birthday is coming up on Friday and is looking forward to that.  She has talked with her mom and her sister recently and they are doing fine.  Pt is going to a winery with her mom and her sister on Saturday for pt's birthday.  Pt is also wanting to sort thought some of the papers in her living room this  afternoon.  Encouraged pt to continue with her self care activities and we will meet in 2 wks for a follow up session. ? ?Interventions: Cognitive Behavioral Therapy ? ?Diagnosis:Adjustment disorder with depressed mood ? ?Plan: Treatment Plan ?Strengths/Abilities:  Intelligent, Intuitive, Willing to participate in therapy ?Treatment Preferences:  Outpatient Individual Therapy ?Statement of Needs:  Patient is to use CBT, mindfulness and coping skills to help manage and/or decrease symptoms associated with their diagnosis. ?Symptoms:  Depressed/Irritable mood, worry, social withdrawal ?Problems Addressed:  Depressive thoughts, Sadness, Sleep issues, etc. ?Long Term Goals:  Pt to reduce overall level, frequency, and intensity of the feelings of depression as evidenced by decreased irritability, negative self talk, and helpless feelings from 6 to 7 days/week to 0 to 1 days/week, per client report, for at least 3 consecutive months.  Progress: 20% ?Short Term Goals:  Pt to verbally express understanding of the relationship between feelings of depression and their impact on thinking patterns and behaviors.  Pt to verbalize an understanding of the role that distorted thinking plays in creating fears, excessive worry, and ruminations.  Progress: 20% ?Target Date:  03/19/2022 ?Frequency:  Weekly ?Modality:  Cognitive Behavioral Therapy ?Interventions by Therapist:  Therapist will use CBT, Mindfulness exercises, Coping skills and Referrals, as needed by client. ? ? ?Ivan Anchors, [facility] ?

## 2021-07-22 ENCOUNTER — Ambulatory Visit: Payer: No Typology Code available for payment source | Admitting: Nurse Practitioner

## 2021-07-30 ENCOUNTER — Ambulatory Visit (INDEPENDENT_AMBULATORY_CARE_PROVIDER_SITE_OTHER): Payer: No Typology Code available for payment source | Admitting: Psychology

## 2021-07-30 DIAGNOSIS — F4321 Adjustment disorder with depressed mood: Secondary | ICD-10-CM | POA: Diagnosis not present

## 2021-07-30 NOTE — Progress Notes (Signed)
[place] Counselor/Therapist Progress Note ? ?Patient ID: Wendy Pineda, MRN: [id number],   ? ?Date: 07/30/2021 ? ?Time Spent: 45 mins ? ?Treatment Type: Individual Therapy ? ?Reported Symptoms: Pt presents for today's session, via webex video, due to virus outbreak.  Pt shares that she is in her home with no one else present.  I shared with pt that I am in my office with no one else here with me either.   ? ? ?Mental Status Exam: ?Appearance:  Casual     ?Behavior: Appropriate  ?Motor: Normal  ?Speech/Language:  Clear and Coherent  ?Affect: Appropriate  ?Mood: normal  ?Thought process: normal  ?Thought content:   WNL  ?Sensory/Perceptual disturbances:   WNL  ?Orientation: oriented to person, place, and time/date  ?Attention: Good  ?Concentration: Good  ?Memory: WNL  ?Fund of knowledge:  Good  ?Insight:   Good  ?Judgment:  Good  ?Impulse Control: Good  ? ?Risk Assessment: ?Danger to Self:  No ?Self-injurious Behavior: No ?Danger to Others: No ?Duty to Warn:no ?Physical Aggression / Violence:No  ?Access to Firearms a concern: No  ?Gang Involvement:No  ? ?Subjective: Pt shares that she has gotten notification from the court that the CDs she ordered are in the courthouse.  She has another court date tomorrow and she believes her new attorney will go with her for that event.  Pt is planning to go to [place] today to drop off an appeal that she is submitting; she will also go to [place] today to see her aunt whose birthday was yesterday.  Pt shares that her ex-husband sent her a package of Angelina for her birthday with a card saying that her misses her and he loves her (he has re-married 2 yrs ago).  She is not sure what this means but I encouraged pt to enjoy the contents of the package.  Pt is able to relate several examples of God's faithfulness to her over her life and she is bolstered by these examples.  Pt shares that her birthday was "kind of quiet."  She talked with friends and family and got  the delivery from her ex-husband.  She did not go to the winery with her sister and her mom because of the rain on Saturday.  They had dinner with her family in [place] on Saturday night (14 attendees).  She felt appreciated.  Encouraged pt to continue with her self care activities and we will meet in 2 wks for a follow up session. ? ?Interventions: Cognitive Behavioral Therapy ? ?Diagnosis:Adjustment disorder with depressed mood ? ?Plan: Treatment Plan ?Strengths/Abilities:  Intelligent, Intuitive, Willing to participate in therapy ?Treatment Preferences:  Outpatient Individual Therapy ?Statement of Needs:  Patient is to use CBT, mindfulness and coping skills to help manage and/or decrease symptoms associated with their diagnosis. ?Symptoms:  Depressed/Irritable mood, worry, social withdrawal ?Problems Addressed:  Depressive thoughts, Sadness, Sleep issues, etc. ?Long Term Goals:  Pt to reduce overall level, frequency, and intensity of the feelings of depression as evidenced by decreased irritability, negative self talk, and helpless feelings from 6 to 7 days/week to 0 to 1 days/week, per client report, for at least 3 consecutive months.  Progress: 20% ?Short Term Goals:  Pt to verbally express understanding of the relationship between feelings of depression and their impact on thinking patterns and behaviors.  Pt to verbalize an understanding of the role that distorted thinking plays in creating fears, excessive worry, and ruminations.  Progress: 20% ?Target Date:  03/19/2022 ?Frequency:  Weekly ?Modality:  Cognitive Behavioral Therapy ?Interventions by Therapist:  Therapist will use CBT, Mindfulness exercises, Coping skills and Referrals, as needed by client. ? ? ?Ivan Anchors, [facility] ?

## 2021-08-04 ENCOUNTER — Other Ambulatory Visit: Payer: Self-pay

## 2021-08-04 MED ORDER — VITAMIN D (ERGOCALCIFEROL) 1.25 MG (50000 UNIT) PO CAPS: 50000.0000 [IU] | ORAL_CAPSULE | ORAL | 1 refills | Status: DC

## 2021-08-09 ENCOUNTER — Other Ambulatory Visit: Payer: Self-pay | Admitting: Nurse Practitioner

## 2021-08-13 ENCOUNTER — Encounter: Payer: Self-pay | Admitting: Nurse Practitioner

## 2021-08-13 ENCOUNTER — Ambulatory Visit: Payer: No Typology Code available for payment source | Admitting: Nurse Practitioner

## 2021-08-13 VITALS — BP 128/72 | HR 71 | Temp 98.4°F | Ht 66.4 in | Wt 179.0 lb

## 2021-08-13 DIAGNOSIS — E559 Vitamin D deficiency, unspecified: Secondary | ICD-10-CM

## 2021-08-13 DIAGNOSIS — E7800 Pure hypercholesterolemia, unspecified: Secondary | ICD-10-CM

## 2021-08-13 DIAGNOSIS — Z5320 Procedure and treatment not carried out because of patient's decision for unspecified reasons: Secondary | ICD-10-CM

## 2021-08-13 DIAGNOSIS — F32A Depression, unspecified: Secondary | ICD-10-CM

## 2021-08-13 DIAGNOSIS — F419 Anxiety disorder, unspecified: Secondary | ICD-10-CM

## 2021-08-13 NOTE — Progress Notes (Signed)
I,Tianna Badgett,acting as a Education administrator for [employer], FNP.,have documented all relevant documentation on the behalf of Minette Brine, FNP,as directed by  Minette Brine, FNP while in the presence of Minette Brine, [place].  This visit occurred during the SARS-CoV-2 public health emergency.  Safety protocols were in place, including screening questions prior to the visit, additional usage of staff PPE, and extensive cleaning of exam room while observing appropriate contact time as indicated for disinfecting solutions.  Subjective:     Patient ID: Wendy Pineda , female    DOB: [birth year] , 56 y.o.   MRN: [id number]   Chief Complaint  Patient presents with   Anxiety    HPI  Here to follow up on abnormal weight loss. In the last 3 weeks she has been eating more fast food.   She continues to see the counselor. The counselor still has her out of work. She is seeing him every 2 weeks. She had to cancel her appt here last month due to dealing with her court case.  She is concerned about her ex sending her cards in the mail for her birthday.    Wt Readings from Last 3 Encounters: 08/13/21 : 179 lb (81.2 kg) 06/18/21 : 171 lb 9.6 oz (77.8 kg) 03/31/21 : 183 lb (83 kg)    Anxiety Presents for follow-up visit. Symptoms include nervous/anxious behavior. Patient reports no chest pain, insomnia, palpitations or shortness of breath.    Depression        This is a recurrent problem.  Associated symptoms include no fatigue, no hopelessness and does not have insomnia.  Past treatments include SSRIs - Selective serotonin reuptake inhibitors.  Past medical history includes anxiety.      Past Medical History:  Diagnosis Date   Anemia    Anxiety    on meds   Arthritis    bilateral knees   Carotid bruit    carotid dopplers 12/22/2010, no hemodynamically significant stenosis   Depression    on meds   GERD (gastroesophageal reflux disease)    occasional-tums prn-overindulgence   Heart murmur    echo  10/11/2007 EF >55%mild, MR   Hx of chest pain    neg. lexiscan myoview 02/23/2011   Hyperlipidemia    diet controlled   Sickle cell trait ([place])      Family History  Problem Relation Age of Onset   Colon polyps Mother 82   Breast cancer Mother    Stroke Maternal Grandmother    Colon polyps Maternal Grandfather 72   Colon cancer Maternal Grandfather 72   Esophageal cancer Neg Hx    Rectal cancer Neg Hx    Stomach cancer Neg Hx      Current Outpatient Medications:    Ascorbic Acid (VITAMIN C PO), Take 1 tablet by mouth daily at 12 noon., Disp: , Rfl:    B Complex Vitamins (B COMPLEX PO), Take 1 packet by mouth daily. Reported on 06/27/2015, Disp: , Rfl:    COLLAGEN PO, Take 1 Scoop by mouth every other day., Disp: , Rfl:    escitalopram (LEXAPRO) 10 MG tablet, TAKE 1 TABLET(10 MG) BY MOUTH DAILY, Disp: 30 tablet, Rfl: 1   Magnesium 250 MG TABS, Take 1 tablet (250 mg total) by mouth every evening., Disp: 90 tablet, Rfl: 0   Multiple Vitamin (MULTIVITAMIN PO), Take 1 tablet by mouth daily at 2 am., Disp: , Rfl:    Multiple Vitamins-Minerals (ZINC PO), Take 1 tablet by mouth daily at 12 noon. (  Patient not taking: Reported on 03/17/2021), Disp: , Rfl:    VITAMIN D PO, Take 1 tablet by mouth daily., Disp: , Rfl:    Vitamin D, Ergocalciferol, (DRISDOL) 1.25 MG (50000 UNIT) CAPS capsule, Take 1 capsule (50,000 Units total) by mouth once a week., Disp: 5 capsule, Rfl: 1   vortioxetine HBr (TRINTELLIX) 5 MG TABS tablet, Take 5 mg by mouth daily., Disp: , Rfl:    Allergies  Allergen Reactions   Sulfa Antibiotics Hives, Swelling and Rash   Elemental Sulfur Hives, Swelling and Rash     Review of Systems  Constitutional: Negative.  Negative for fatigue.  Respiratory: Negative.  Negative for cough and shortness of breath.   Cardiovascular: Negative.  Negative for chest pain, palpitations and leg swelling.  Gastrointestinal: Negative.   Neurological: Negative.   Psychiatric/Behavioral:   Positive for depression. The patient is nervous/anxious. The patient does not have insomnia.      Today's Vitals   08/13/21 1521  BP: 128/72  Pulse: 71  Temp: 98.4 F (36.9 C)  TempSrc: Oral  Weight: 179 lb (81.2 kg)  Height: 5' 6.4" (1.687 m)   Body mass index is 28.54 kg/m.  Wt Readings from Last 3 Encounters:  08/13/21 179 lb (81.2 kg)  06/18/21 171 lb 9.6 oz (77.8 kg)  03/31/21 183 lb (83 kg)    Objective:  Physical Exam Vitals reviewed.  Constitutional:      General: She is not in acute distress.    Appearance: Normal appearance.  Cardiovascular:     Rate and Rhythm: Normal rate and regular rhythm.     Pulses: Normal pulses.     Heart sounds: Normal heart sounds. No murmur heard. Pulmonary:     Effort: Pulmonary effort is normal. No respiratory distress.     Breath sounds: Normal breath sounds.  Neurological:     Mental Status: She is alert.  Psychiatric:        Mood and Affect: Mood normal.        Behavior: Behavior normal.        Thought Content: Thought content normal.        Judgment: Judgment normal.         Assessment And Plan:     1. Anxiety and depression Comments: She continues to see the counselor biweekly. She is tolerating medications well.  2. Hypercholesteremia Comments: Continue diet low in fat. No current medications  3. Vitamin D deficiency Will check vitamin D level and supplement as needed.    Also encouraged to spend 15 minutes in the sun daily.   4. Statin declined Comments: Discussed risk of heart disease to include hypertension, stroke, MI and CHF.   The 10-year ASCVD risk score (Arnett DK, et al., 2019) is: 2.7%   Values used to calculate the score:     Age: 61 years     Sex: Female     Is Non-Hispanic African American: Yes     Diabetic: No     Tobacco smoker: No     Systolic Blood Pressure: 536 mmHg     Is BP treated: No     HDL Cholesterol: 77 mg/dL     Total Cholesterol: 269 mg/dL   Patient was given opportunity  to ask questions. Patient verbalized understanding of the plan and was able to repeat key elements of the plan. All questions were answered to their satisfaction.  Minette Brine, FNP   I, Minette Brine, FNP, have reviewed all documentation for this visit.  The documentation on 08/13/21 for the exam, diagnosis, procedures, and orders are all accurate and complete.   IF YOU HAVE BEEN REFERRED TO A SPECIALIST, IT MAY TAKE 1-2 WEEKS TO SCHEDULE/PROCESS THE REFERRAL. IF YOU HAVE NOT HEARD FROM US/SPECIALIST IN TWO WEEKS, PLEASE GIVE [place] A CALL AT [phone] X [phone].   THE PATIENT IS ENCOURAGED TO PRACTICE SOCIAL DISTANCING DUE TO THE COVID-19 PANDEMIC.

## 2021-08-13 NOTE — Patient Instructions (Addendum)
Generalized Anxiety Disorder, Adult ?Generalized anxiety disorder (GAD) is a mental health condition. Unlike normal worries, anxiety related to GAD is not triggered by a specific event. These worries do not fade or get better with time. GAD interferes with relationships, work, and school. ?GAD symptoms can vary from mild to severe. People with severe GAD can have intense waves of anxiety with physical symptoms that are similar to panic attacks. ?What are the causes? ?The exact cause of GAD is not known, but the following are believed to have an impact: ?Differences in natural brain chemicals. ?Genes passed down from parents to children. ?Differences in the way threats are perceived. ?Development and stress during childhood. ?Personality. ?What increases the risk? ?The following factors may make you more likely to develop this condition: ?Being female. ?Having a family history of anxiety disorders. ?Being very shy. ?Experiencing very stressful life events, such as the death of a loved one. ?Having a very stressful family environment. ?What are the signs or symptoms? ?People with GAD often worry excessively about many things in their lives, such as their health and family. Symptoms may also include: ?Mental and emotional symptoms: ?Worrying excessively about natural disasters. ?Fear of being late. ?Difficulty concentrating. ?Fears that others are judging your performance. ?Physical symptoms: ?Fatigue. ?Headaches, muscle tension, muscle twitches, trembling, or feeling shaky. ?Feeling like your heart is pounding or beating very fast. ?Feeling out of breath or like you cannot take a deep breath. ?Having trouble falling asleep or staying asleep, or experiencing restlessness. ?Sweating. ?Nausea, diarrhea, or irritable bowel syndrome (IBS). ?Behavioral symptoms: ?Experiencing erratic moods or irritability. ?Avoidance of new situations. ?Avoidance of people. ?Extreme difficulty making decisions. ?How is this diagnosed? ?This  condition is diagnosed based on your symptoms and medical history. You will also have a physical exam. Your health care provider may perform tests to rule out other possible causes of your symptoms. ?To be diagnosed with GAD, a person must have anxiety that: ?Is out of his or her control. ?Affects several different aspects of his or her life, such as work and relationships. ?Causes distress that makes him or her unable to take part in normal activities. ?Includes at least three symptoms of GAD, such as restlessness, fatigue, trouble concentrating, irritability, muscle tension, or sleep problems. ?Before your health care provider can confirm a diagnosis of GAD, these symptoms must be present more days than they are not, and they must last for 6 months or longer. ?How is this treated? ?This condition may be treated with: ?Medicine. Antidepressant medicine is usually prescribed for long-term daily control. Anti-anxiety medicines may be added in severe cases, especially when panic attacks occur. ?Talk therapy (psychotherapy). Certain types of talk therapy can be helpful in treating GAD by providing support, education, and guidance. Options include: ?Cognitive behavioral therapy (CBT). People learn coping skills and self-calming techniques to ease their physical symptoms. They learn to identify unrealistic thoughts and behaviors and to replace them with more appropriate thoughts and behaviors. ?Acceptance and commitment therapy (ACT). This treatment teaches people how to be mindful as a way to cope with unwanted thoughts and feelings. ?Biofeedback. This process trains you to manage your body's response (physiological response) through breathing techniques and relaxation methods. You will work with a therapist while machines are used to monitor your physical symptoms. ?Stress management techniques. These include yoga, meditation, and exercise. ?A mental health specialist can help determine which treatment is best for you.  Some people see improvement with one type of therapy. However, other people require   a combination of therapies. ?Follow these instructions at home: ?Lifestyle ?Maintain a consistent routine and schedule. ?Anticipate stressful situations. Create a plan and allow extra time to work with your plan. ?Practice stress management or self-calming techniques that you have learned from your therapist or your health care provider. ?Exercise regularly and spend time outdoors. ?Eat a healthy diet that includes plenty of vegetables, fruits, whole grains, low-fat dairy products, and lean protein. ?Do not eat a lot of foods that are high in fat, added sugar, or salt (sodium). ?Drink plenty of water. ?Avoid alcohol. Alcohol can increase anxiety. ?Avoid caffeine and certain over-the-counter cold medicines. These may make you feel worse. Ask your pharmacist which medicines to avoid. ?General instructions ?Take over-the-counter and prescription medicines only as told by your health care provider. ?Understand that you are likely to have setbacks. Accept this and be kind to yourself as you persist to take better care of yourself. ?Anticipate stressful situations. Create a plan and allow extra time to work with your plan. ?Recognize and accept your accomplishments, even if you judge them as small. ?Spend time with people who care about you. ?Keep all follow-up visits. This is important. ?Where to find more information ?[employer] of Mental Health: https://carter.com/ ?Substance Abuse and Mental Health Services: ktimeonline.com ?Contact a health care provider if: ?Your symptoms do not get better. ?Your symptoms get worse. ?You have signs of depression, such as: ?A persistently sad or irritable mood. ?Loss of enjoyment in activities that used to bring you joy. ?Change in weight or eating. ?Changes in sleeping habits. ?Get help right away if: ?You have thoughts about hurting yourself or others. ?If you ever feel like you may hurt  yourself or others, or have thoughts about taking your own life, get help right away. Go to your nearest emergency department or: ?Call your local emergency services (911 in the [place] ?Call a suicide crisis helpline, such as the [place] at 670 563 1811 or 988 in the [place]. This is open 24 hours a day in the [place] ?Text the Crisis Text Line at (651) 318-4442 (in the [place].). ?Summary ?Generalized anxiety disorder (GAD) is a mental health condition that involves worry that is not triggered by a specific event. ?People with GAD often worry excessively about many things in their lives, such as their health and family. ?GAD may cause symptoms such as restlessness, trouble concentrating, sleep problems, frequent sweating, nausea, diarrhea, headaches, and trembling or muscle twitching. ?A mental health specialist can help determine which treatment is best for you. Some people see improvement with one type of therapy. However, other people require a combination of therapies. ?This information is not intended to replace advice given to you by your health care provider. Make sure you discuss any questions you have with your health care provider. ?Document Revised: 10/15/2020 Document Reviewed: 07/13/2020 ?Elsevier Patient Education ? [place]. ? ?Heart-Healthy Eating Plan ?Many factors influence your heart (coronary) health, including eating and exercise habits. Coronary risk increases with abnormal blood fat (lipid) levels. Heart-healthy meal planning includes limiting unhealthy fats, increasing healthy fats, and making other diet and lifestyle changes. ?What is my plan? ?Your health care provider may recommend that you: ?Limit your fat intake to _________% or less of your total calories each day. ?Limit your saturated fat intake to _________% or less of your total calories each day. ?Limit the amount of cholesterol in your diet to less than _________ mg per day. ?What are tips for following this  plan? ?Cooking ?Lacinda Axon  foods using methods other than frying. Baking, boiling, grilling, and broiling are all good options. Other ways to reduce fat include: ?Removing the skin from poultry. ?Removing all vi

## 2021-08-14 ENCOUNTER — Ambulatory Visit (INDEPENDENT_AMBULATORY_CARE_PROVIDER_SITE_OTHER): Payer: No Typology Code available for payment source | Admitting: Psychology

## 2021-08-14 DIAGNOSIS — F4321 Adjustment disorder with depressed mood: Secondary | ICD-10-CM

## 2021-08-14 NOTE — Progress Notes (Signed)
[place] Counselor/Therapist Progress Note ? ?Patient ID: Wendy Pineda, MRN: [id number],   ? ?Date: 08/14/2021 ? ?Time Spent: 45 mins ? ?Treatment Type: Individual Therapy ? ?Reported Symptoms: Pt presents for today's session, via webex video, due to virus outbreak.  Pt shares that she is in her home with no one else present.  I shared with pt that I am in my office with no one else here with me either.   ? ? ?Mental Status Exam: ?Appearance:  Casual     ?Behavior: Appropriate  ?Motor: Normal  ?Speech/Language:  Clear and Coherent  ?Affect: Appropriate  ?Mood: normal  ?Thought process: normal  ?Thought content:   WNL  ?Sensory/Perceptual disturbances:   WNL  ?Orientation: oriented to person, place, and time/date  ?Attention: Good  ?Concentration: Good  ?Memory: WNL  ?Fund of knowledge:  Good  ?Insight:   Good  ?Judgment:  Good  ?Impulse Control: Good  ? ?Risk Assessment: ?Danger to Self:  No ?Self-injurious Behavior: No ?Danger to Others: No ?Duty to Warn:no ?Physical Aggression / Violence:No  ?Access to Firearms a concern: No  ?Gang Involvement:No  ? ?Subjective: Pt shares that she has "been pretty good since our last session."  She is sitting on her porch today enjoy the outdoors.  She has court coming up on Tuesday and her attorney will be going with her; her attorney has submitted subpoenas for her ex-husband's financial records.  She is trying to be judicious in how often she talks with her because of the expense involved.  Pt shares she went to her sister's home in [place] last weekend for a family meal.  She stayed until Wendy Pineda and enjoyed getting out of town and spending time with family members.  Pt shares that she received a Mother's Day card from her ex-husband that was very kind.  She is aware that he has been manipulative in the past and she is not sure why he sent the card to her.  Encouraged pt to enjoy the message of the card and to allow herself to be certain that she has always been a  great mother to Wendy Pineda.  Wendy Pineda is coming to visit her this weekend for Mother's Day.  Pt again reiterates that she knows that God will sustain her at this time of having to go back to court this week.  Pt shares that her ex-husband came to court on their last court date on 4/28; "that was the first time he had come to court."  Pt shares she feels like her attorney is doing a good job of supporting her and feels really good about that.  Encouraged pt to continue with her self care activities and we will meet in 2 wks for a follow up session. ? ?Interventions: Cognitive Behavioral Therapy ? ?Diagnosis:Adjustment disorder with depressed mood ? ?Plan: Treatment Plan ?Strengths/Abilities:  Intelligent, Intuitive, Willing to participate in therapy ?Treatment Preferences:  Outpatient Individual Therapy ?Statement of Needs:  Patient is to use CBT, mindfulness and coping skills to help manage and/or decrease symptoms associated with their diagnosis. ?Symptoms:  Depressed/Irritable mood, worry, social withdrawal ?Problems Addressed:  Depressive thoughts, Sadness, Sleep issues, etc. ?Long Term Goals:  Pt to reduce overall level, frequency, and intensity of the feelings of depression as evidenced by decreased irritability, negative self talk, and helpless feelings from 6 to 7 days/week to 0 to 1 days/week, per client report, for at least 3 consecutive months.  Progress: 20% ?Short Term Goals:  Pt to verbally express  understanding of the relationship between feelings of depression and their impact on thinking patterns and behaviors.  Pt to verbalize an understanding of the role that distorted thinking plays in creating fears, excessive worry, and ruminations.  Progress: 20% ?Target Date:  03/19/2022 ?Frequency:  Weekly ?Modality:  Cognitive Behavioral Therapy ?Interventions by Therapist:  Therapist will use CBT, Mindfulness exercises, Coping skills and Referrals, as needed by client. ? ? ?Ivan Anchors, [facility] ?

## 2021-08-27 ENCOUNTER — Ambulatory Visit: Payer: No Typology Code available for payment source | Admitting: Psychology

## 2021-09-01 ENCOUNTER — Ambulatory Visit (INDEPENDENT_AMBULATORY_CARE_PROVIDER_SITE_OTHER): Payer: No Typology Code available for payment source | Admitting: Psychology

## 2021-09-01 DIAGNOSIS — F4321 Adjustment disorder with depressed mood: Secondary | ICD-10-CM

## 2021-09-01 NOTE — Progress Notes (Signed)
[place] Counselor/Therapist Progress Note  Patient ID: Wendy Pineda, MRN: [id number],    Date: 09/01/2021  Time Spent: 45 mins  Treatment Type: Individual Therapy  Reported Symptoms: Pt presents for today's session, via webex video, due to virus outbreak.  Pt shares that she is in her home with no one else present.  I shared with pt that I am in my office with no one else here with me either.     Mental Status Exam: Appearance:  Casual     Behavior: Appropriate  Motor: Normal  Speech/Language:  Clear and Coherent  Affect: Appropriate  Mood: normal  Thought process: normal  Thought content:   WNL  Sensory/Perceptual disturbances:   WNL  Orientation: oriented to person, place, and time/date  Attention: Good  Concentration: Good  Memory: WNL  Fund of knowledge:  Good  Insight:   Good  Judgment:  Good  Impulse Control: Good   Risk Assessment: Danger to Self:  No Self-injurious Behavior: No Danger to Others: No Duty to Warn:no Physical Aggression / Violence:No  Access to Firearms a concern: No  Gang Involvement:No   Subjective: Pt shares that she has "a lot to update you on since our last session.  I missed our last session because I did not sleep well the night before due to being anxious.  I had never had that happen before and it was scary for me.  Pt shares that she had court on 5/20 and her husband's attorney did not show up that day.  Pt does feel good about what her attorney is doing for her.  Marveen Reeks contacted the other attorney by Zoom and they were able to reschedule the court date but pt is not sure when they will go back to court.  Pt shares that she has gotten a message from her supervisor who informed her they were going to terminate pt on 09/04/21 because she had extended her leave.  Pt told her supervisor that she had not extended her leave and she asked how she can appeal the decision to terminate pt.  She was told to contact HR to get info.  Pt  shares that her ex-husband has been contacting other family members via FB to tell them that he misses them and he has contacted pt (as noted in previous session notes) and told her how much he misses pt still, even though he has remarried.  Pt shares that [name] and his girlfriend came to see pt and her parents for Mother's Day and pt enjoyed the visit and the dinner they shared.  Pt also shared a Mother's Day dinner at her aunt's home in [place].  Encouraged pt to continue with her self care activities and we will meet next week for a follow up session.  Interventions: Cognitive Behavioral Therapy  Diagnosis:Adjustment disorder with depressed mood  Plan: Treatment Plan Strengths/Abilities:  Intelligent, Intuitive, Willing to participate in therapy Treatment Preferences:  Outpatient Individual Therapy Statement of Needs:  Patient is to use CBT, mindfulness and coping skills to help manage and/or decrease symptoms associated with their diagnosis. Symptoms:  Depressed/Irritable mood, worry, social withdrawal Problems Addressed:  Depressive thoughts, Sadness, Sleep issues, etc. Long Term Goals:  Pt to reduce overall level, frequency, and intensity of the feelings of depression as evidenced by decreased irritability, negative self talk, and helpless feelings from 6 to 7 days/week to 0 to 1 days/week, per client report, for at least 3 consecutive months.  Progress: 20% Short Term Goals:  Pt to verbally express understanding of the relationship between feelings of depression and their impact on thinking patterns and behaviors.  Pt to verbalize an understanding of the role that distorted thinking plays in creating fears, excessive worry, and ruminations.  Progress: 20% Target Date:  03/19/2022 Frequency:  Weekly Modality:  Cognitive Behavioral Therapy Interventions by Therapist:  Therapist will use CBT, Mindfulness exercises, Coping skills and Referrals, as needed by client.   Ivan Anchors, [facility]

## 2021-09-11 ENCOUNTER — Ambulatory Visit (INDEPENDENT_AMBULATORY_CARE_PROVIDER_SITE_OTHER): Payer: No Typology Code available for payment source | Admitting: Psychology

## 2021-09-11 DIAGNOSIS — F4321 Adjustment disorder with depressed mood: Secondary | ICD-10-CM

## 2021-09-11 NOTE — Progress Notes (Signed)
[place] Counselor/Therapist Progress Note  Patient ID: Wendy Pineda, MRN: [id number],    Date: 09/11/2021  Time Spent: 45 mins  Treatment Type: Individual Therapy  Reported Symptoms: Pt presents for today's session, via webex video.  Pt shares that she is in her home with no one else present.  I shared with pt that I am in my office with no one else here with me either.     Mental Status Exam: Appearance:  Casual     Behavior: Appropriate  Motor: Normal  Speech/Language:  Clear and Coherent  Affect: Appropriate  Mood: normal  Thought process: normal  Thought content:   WNL  Sensory/Perceptual disturbances:   WNL  Orientation: oriented to person, place, and time/date  Attention: Good  Concentration: Good  Memory: WNL  Fund of knowledge:  Good  Insight:   Good  Judgment:  Good  Impulse Control: Good   Risk Assessment: Danger to Self:  No Self-injurious Behavior: No Danger to Others: No Duty to Warn:no Physical Aggression / Violence:No  Access to Firearms a concern: No  Gang Involvement:No   Subjective: Pt shares that she has realized that she tends to make excuses for the behaviors of others ("like my mom and my sister").  Pt shares that she has realized that her mom has interacted with her in this way "for all of my life."  We talked about how she can set limits for others who treat [place] poorly.  Pt is able to see that her mom has had a lot to deal with in her life and that has been hard on her and she may end up taking that out on others.  Pt tells me more info about her biological father who was not the same man who raised her with her mom.  She knows her biological father who mixed race; "I really want to be able to connect with him because of the differences I see between my and my siblings.  Pt has introduced [name] to his grandfather in the past.  Pt is connected to her larger extended family.  Pt shares that she has another court date in August.  Encouraged  pt to continue with her self care activities and pt will call the office to schedule a follow up session.  Interventions: Cognitive Behavioral Therapy  Diagnosis:Adjustment disorder with depressed mood  Plan: Treatment Plan Strengths/Abilities:  Intelligent, Intuitive, Willing to participate in therapy Treatment Preferences:  Outpatient Individual Therapy Statement of Needs:  Patient is to use CBT, mindfulness and coping skills to help manage and/or decrease symptoms associated with their diagnosis. Symptoms:  Depressed/Irritable mood, worry, social withdrawal Problems Addressed:  Depressive thoughts, Sadness, Sleep issues, etc. Long Term Goals:  Pt to reduce overall level, frequency, and intensity of the feelings of depression as evidenced by decreased irritability, negative self talk, and helpless feelings from 6 to 7 days/week to 0 to 1 days/week, per client report, for at least 3 consecutive months.  Progress: 20% Short Term Goals:  Pt to verbally express understanding of the relationship between feelings of depression and their impact on thinking patterns and behaviors.  Pt to verbalize an understanding of the role that distorted thinking plays in creating fears, excessive worry, and ruminations.  Progress: 20% Target Date:  03/19/2022 Frequency:  Weekly Modality:  Cognitive Behavioral Therapy Interventions by Therapist:  Therapist will use CBT, Mindfulness exercises, Coping skills and Referrals, as needed by client.   Ivan Anchors, [facility]

## 2021-09-29 ENCOUNTER — Ambulatory Visit (INDEPENDENT_AMBULATORY_CARE_PROVIDER_SITE_OTHER): Payer: No Typology Code available for payment source | Admitting: Psychology

## 2021-09-29 DIAGNOSIS — F4321 Adjustment disorder with depressed mood: Secondary | ICD-10-CM

## 2021-10-08 ENCOUNTER — Other Ambulatory Visit: Payer: Self-pay

## 2021-10-09 ENCOUNTER — Other Ambulatory Visit: Payer: Self-pay

## 2021-10-09 MED ORDER — ESCITALOPRAM OXALATE 10 MG PO TABS: ORAL_TABLET | ORAL | 1 refills | Status: DC

## 2021-10-29 ENCOUNTER — Ambulatory Visit (INDEPENDENT_AMBULATORY_CARE_PROVIDER_SITE_OTHER): Payer: No Typology Code available for payment source | Admitting: Psychology

## 2021-10-29 DIAGNOSIS — F4321 Adjustment disorder with depressed mood: Secondary | ICD-10-CM | POA: Diagnosis not present

## 2021-10-29 NOTE — Progress Notes (Signed)
[place]ined[place] Counselor/Therapist Progress Note  Patient ID: Wendy Pineda, MRN: [id number],    Date: 10/29/2021  Time Spent: 45 mins  Treatment Type: Individual Therapy  Reported Symptoms: Pt presents for today's session, via webex video.  Pt shares that she is in her home with no one else present.  I shared with pt that I am in my office with no one else here with me either.     Mental Status Exam: Appearance:  Casual     Behavior: Appropriate  Motor: Normal  Speech/Language:  Clear and Coherent  Affect: Appropriate  Mood: normal  Thought process: normal  Thought content:   WNL  Sensory/Perceptual disturbances:   WNL  Orientation: oriented to person, place, and time/date  Attention: Good  Concentration: Good  Memory: WNL  Fund of knowledge:  Good  Insight:   Good  Judgment:  Good  Impulse Control: Good   Risk Assessment: Danger to Self:  No Self-injurious Behavior: No Danger to Others: No Duty to Warn:no Physical Aggression / Violence:No  Access to Firearms a concern: No  Gang Involvement:No   Subjective: Pt shares that "I am doing pretty good since our last session.  We had calendar call on Monday but I have not heard from my attorney since then."  Her next court date is 11/19/21 for a pre-trial hearing and the trial itself.  Pt shares she has left several messages for her attorney to see if she has received all of the info from her husband's attorney that they need for her 8/17 court date.  Talked with pt about the need to trust her attorney to continue to advocate for pt.  Pt states that, if she is able to do so, it would be much easier on her.  Encouraged pt to focus on being able to trust her attorney in this way for her benefit. Encouraged pt to continue to engage in self care activities between now and her court date.  Encouraged pt to use deep breathing to help settle her down whenever she needs it.  Pt shares that she knows she can trust God to support her  through this legal process.  Pt shares she talked with [name] for a couple of hours two nights ago and really enjoyed that conversation.  [name] and his girlfriend and her family went camping and they had a great time.  She has visited with her family in [place] and [place] since our last visit; she enjoys seeing them from time to time.  She also attended a new conference in [place] recently as well; her cousin went with her as well.  She also spent time with her mom too.  Encouraged pt to continue with her self care activities and we will meet in 4 wks a follow up session.  Interventions: Cognitive Behavioral Therapy  Diagnosis:Adjustment disorder with depressed mood  Plan: Treatment Plan Strengths/Abilities:  Intelligent, Intuitive, Willing to participate in therapy Treatment Preferences:  Outpatient Individual Therapy Statement of Needs:  Patient is to use CBT, mindfulness and coping skills to help manage and/or decrease symptoms associated with their diagnosis. Symptoms:  Depressed/Irritable mood, worry, social withdrawal Problems Addressed:  Depressive thoughts, Sadness, Sleep issues, etc. Long Term Goals:  Pt to reduce overall level, frequency, and intensity of the feelings of depression as evidenced by decreased irritability, negative self talk, and helpless feelings from 6 to 7 days/week to 0 to 1 days/week, per client report, for at least 3 consecutive months.  Progress: 20% Short  Term Goals:  Pt to verbally express understanding of the relationship between feelings of depression and their impact on thinking patterns and behaviors.  Pt to verbalize an understanding of the role that distorted thinking plays in creating fears, excessive worry, and ruminations.  Progress: 20% Target Date:  03/19/2022 Frequency:  Weekly Modality:  Cognitive Behavioral Therapy Interventions by Therapist:  Therapist will use CBT, Mindfulness exercises, Coping skills and Referrals, as needed by client.   Ivan Anchors, [facility]

## 2021-11-20 ENCOUNTER — Ambulatory Visit (INDEPENDENT_AMBULATORY_CARE_PROVIDER_SITE_OTHER): Payer: No Typology Code available for payment source | Admitting: Psychology

## 2021-11-20 DIAGNOSIS — F4321 Adjustment disorder with depressed mood: Secondary | ICD-10-CM | POA: Diagnosis not present

## 2021-11-20 NOTE — Progress Notes (Signed)
[place] Counselor/Therapist Progress Note  Patient ID: Wendy Pineda, MRN: [id number],    Date: 11/20/2021  Time Spent: 45 mins  Treatment Type: Individual Therapy  Reported Symptoms: Pt presents for today's session, via webex video.  Pt shares that she is in her home with no one else present.  I shared with pt that I am in my office with no one else here with me either.     Mental Status Exam: Appearance:  Casual     Behavior: Appropriate  Motor: Normal  Speech/Language:  Clear and Coherent  Affect: Appropriate  Mood: normal  Thought process: normal  Thought content:   WNL  Sensory/Perceptual disturbances:   WNL  Orientation: oriented to person, place, and time/date  Attention: Good  Concentration: Good  Memory: WNL  Fund of knowledge:  Good  Insight:   Good  Judgment:  Good  Impulse Control: Good   Risk Assessment: Danger to Self:  No Self-injurious Behavior: No Danger to Others: No Duty to Warn:no Physical Aggression / Violence:No  Access to Firearms a concern: No  Gang Involvement:No   Subjective: Pt shares that "It has been a whirlwind.  We had court yesterday and they said I was supposed to get half of his pension and half of his 401K.  He claimed that he did not have his IRA anymore, so he must have hid it."  Pt was able to talk to her attorney the evening prior to her court date on Thursday.  Pt shares that her mom came up to [place] yesterday to go to court with her.  Pt shares that her husband wants to sell the home pt lives in immediately and she has to be out of the home by 12/14/21.  Encouraged pt to use a certified financial planner to help her with how to get the money into an [place] for her so that she does not have to pay taxes on it immediately.  Pt shares that [name] texted her after court yesterday to check on her.  He was disappointed that his dad was taking financial advantage of his mom.  Encouraged pt to continue with her self care activities and  we will meet in 3 wks a follow up session.  Interventions: Cognitive Behavioral Therapy  Diagnosis:Adjustment disorder with depressed mood  Plan: Treatment Plan Strengths/Abilities:  Intelligent, Intuitive, Willing to participate in therapy Treatment Preferences:  Outpatient Individual Therapy Statement of Needs:  Patient is to use CBT, mindfulness and coping skills to help manage and/or decrease symptoms associated with their diagnosis. Symptoms:  Depressed/Irritable mood, worry, social withdrawal Problems Addressed:  Depressive thoughts, Sadness, Sleep issues, etc. Long Term Goals:  Pt to reduce overall level, frequency, and intensity of the feelings of depression as evidenced by decreased irritability, negative self talk, and helpless feelings from 6 to 7 days/week to 0 to 1 days/week, per client report, for at least 3 consecutive months.  Progress: 20% Short Term Goals:  Pt to verbally express understanding of the relationship between feelings of depression and their impact on thinking patterns and behaviors.  Pt to verbalize an understanding of the role that distorted thinking plays in creating fears, excessive worry, and ruminations.  Progress: 20% Target Date:  03/19/2022 Frequency:  Weekly Modality:  Cognitive Behavioral Therapy Interventions by Therapist:  Therapist will use CBT, Mindfulness exercises, Coping skills and Referrals, as needed by client.   Ivan Anchors, [facility]

## 2021-12-04 ENCOUNTER — Other Ambulatory Visit: Payer: Self-pay

## 2021-12-04 MED ORDER — ESCITALOPRAM OXALATE 10 MG PO TABS: ORAL_TABLET | ORAL | 1 refills | Status: DC

## 2021-12-10 ENCOUNTER — Ambulatory Visit (INDEPENDENT_AMBULATORY_CARE_PROVIDER_SITE_OTHER): Payer: No Typology Code available for payment source | Admitting: Psychology

## 2021-12-10 DIAGNOSIS — F4321 Adjustment disorder with depressed mood: Secondary | ICD-10-CM | POA: Diagnosis not present

## 2021-12-10 NOTE — Progress Notes (Signed)
[place] Counselor/Therapist Progress Note  Patient ID: MARELLY WEHRMAN, MRN: [id number],    Date: 12/10/2021  Time Spent: 45 mins  Treatment Type: Individual Therapy  Reported Symptoms: Pt presents for today's session, via phone due to no internet at her home.  Pt shares that she is taking a walk with no one else present.  I shared with pt that I am in my office with no one else here with me either.     Mental Status Exam: Appearance:  Casual     Behavior: Appropriate  Motor: Normal  Speech/Language:  Clear and Coherent  Affect: Appropriate  Mood: normal  Thought process: normal  Thought content:   WNL  Sensory/Perceptual disturbances:   WNL  Orientation: oriented to person, place, and time/date  Attention: Good  Concentration: Good  Memory: WNL  Fund of knowledge:  Good  Insight:   Good  Judgment:  Good  Impulse Control: Good   Risk Assessment: Danger to Self:  No Self-injurious Behavior: No Danger to Others: No Duty to Warn:no Physical Aggression / Violence:No  Access to Firearms a concern: No  Gang Involvement:No   Subjective: Pt shares that "My mom and my aunt came to help me pack up the home since I have to be out of the home by this Sunday."  Pt shares that [name] came last weekend to help her as well.  She is looking for a place to move to but she is troubled by the prices of the apts these days.  Encouraged pt to think about staying with her mom or her aunt for a few weeks to give her more time to make a good decision about where she goes from here.  Pt shares it is hard for her to accept help from others; [name] had offered to pay her mortgage until the end of the year but the judge said the home needed to be sold and the profits split between pt and her ex-husband.  Pt is thinking about applying for a loan to buy the home but she is not sure if she will qualify for the loan.  Pt has made arrangements for the money from her ex-husband's pension and 401K to  go into an IRA with [employer].  Pt is understandably stressed by all of the things that she needs to accomplish by this weekend.  Pt shares she would like to take some time after this week to go somewhere by herself to care and nurture herself for a vacation.  Encouraged pt to pursue this intentionally.  Encouraged pt to continue with her self care activities and we will meet in 2 wks a follow up session.  Interventions: Cognitive Behavioral Therapy  Diagnosis:Adjustment disorder with depressed mood  Plan: Treatment Plan Strengths/Abilities:  Intelligent, Intuitive, Willing to participate in therapy Treatment Preferences:  Outpatient Individual Therapy Statement of Needs:  Patient is to use CBT, mindfulness and coping skills to help manage and/or decrease symptoms associated with their diagnosis. Symptoms:  Depressed/Irritable mood, worry, social withdrawal Problems Addressed:  Depressive thoughts, Sadness, Sleep issues, etc. Long Term Goals:  Pt to reduce overall level, frequency, and intensity of the feelings of depression as evidenced by decreased irritability, negative self talk, and helpless feelings from 6 to 7 days/week to 0 to 1 days/week, per client report, for at least 3 consecutive months.  Progress: 20% Short Term Goals:  Pt to verbally express understanding of the relationship between feelings of depression and their impact on thinking patterns  and behaviors.  Pt to verbalize an understanding of the role that distorted thinking plays in creating fears, excessive worry, and ruminations.  Progress: 20% Target Date:  03/19/2022 Frequency:  Weekly Modality:  Cognitive Behavioral Therapy Interventions by Therapist:  Therapist will use CBT, Mindfulness exercises, Coping skills and Referrals, as needed by client.   Ivan Anchors, [facility]

## 2021-12-24 ENCOUNTER — Ambulatory Visit: Payer: No Typology Code available for payment source | Admitting: Psychology

## 2021-12-25 ENCOUNTER — Ambulatory Visit (INDEPENDENT_AMBULATORY_CARE_PROVIDER_SITE_OTHER): Payer: No Typology Code available for payment source | Admitting: Psychology

## 2021-12-25 DIAGNOSIS — F4321 Adjustment disorder with depressed mood: Secondary | ICD-10-CM
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
## 2022-01-01 ENCOUNTER — Other Ambulatory Visit: Payer: Self-pay | Admitting: Obstetrics and Gynecology

## 2022-01-01 DIAGNOSIS — Z1231 Encounter for screening mammogram for malignant neoplasm of breast: Secondary | ICD-10-CM

## 2022-01-05 ENCOUNTER — Encounter: Payer: Self-pay | Admitting: Nurse Practitioner

## 2022-01-21 ENCOUNTER — Ambulatory Visit (INDEPENDENT_AMBULATORY_CARE_PROVIDER_SITE_OTHER): Payer: No Typology Code available for payment source | Admitting: Psychology

## 2022-01-21 DIAGNOSIS — F4321 Adjustment disorder with depressed mood: Secondary | ICD-10-CM
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
## 2022-01-26 ENCOUNTER — Ambulatory Visit (INDEPENDENT_AMBULATORY_CARE_PROVIDER_SITE_OTHER): Payer: No Typology Code available for payment source | Admitting: Nurse Practitioner

## 2022-01-26 ENCOUNTER — Encounter: Payer: Self-pay | Admitting: Nurse Practitioner

## 2022-01-26 VITALS — BP 120/62 | HR 68 | Temp 98.2°F | Ht 66.0 in | Wt 172.0 lb

## 2022-01-26 DIAGNOSIS — R002 Palpitations: Secondary | ICD-10-CM

## 2022-01-26 DIAGNOSIS — R6889 Other general symptoms and signs: Secondary | ICD-10-CM

## 2022-01-26 DIAGNOSIS — F419 Anxiety disorder, unspecified: Secondary | ICD-10-CM | POA: Diagnosis not present

## 2022-01-26 DIAGNOSIS — Z0000 Encounter for general adult medical examination without abnormal findings: Secondary | ICD-10-CM

## 2022-01-26 DIAGNOSIS — F32A Depression, unspecified: Secondary | ICD-10-CM

## 2022-01-26 DIAGNOSIS — Z2821 Immunization not carried out because of patient refusal: Secondary | ICD-10-CM

## 2022-01-26 DIAGNOSIS — Z0001 Encounter for general adult medical examination with abnormal findings: Secondary | ICD-10-CM

## 2022-01-26 DIAGNOSIS — E559 Vitamin D deficiency, unspecified: Secondary | ICD-10-CM | POA: Diagnosis not present

## 2022-01-26 DIAGNOSIS — E7800 Pure hypercholesterolemia, unspecified: Secondary | ICD-10-CM

## 2022-01-26 NOTE — Patient Instructions (Signed)
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
## 2022-01-27 ENCOUNTER — Ambulatory Visit
Admission: RE | Admit: 2022-01-27 | Discharge: 2022-01-27 | Disposition: A | Discharge status: Home / Self Care | Payer: No Typology Code available for payment source | Source: Ambulatory Visit | Attending: Obstetrics and Gynecology | Admitting: Obstetrics and Gynecology

## 2022-01-27 DIAGNOSIS — Z1231 Encounter for screening mammogram for malignant neoplasm of breast: Secondary | ICD-10-CM

## 2022-01-27 LAB — CBC
Hematocrit: 39.7 % (ref 34.0–46.6)
Hemoglobin: 13.5 g/dL (ref 11.1–15.9)
MCH: 27.3 pg (ref 26.6–33.0)
MCHC: 34 g/dL (ref 31.5–35.7)
MCV: 80 fL (ref 79–97)
Platelets: 318 10*3/uL (ref 150–450)
RBC: 4.94 x10E6/uL (ref 3.77–5.28)
RDW: 12.9 % (ref 11.7–15.4)
WBC: 5 10*3/uL (ref 3.4–10.8)

## 2022-01-27 LAB — CMP14+EGFR
ALT: 19 IU/L (ref 0–32)
AST: 19 IU/L (ref 0–40)
Albumin/Globulin Ratio: 1.7 (ref 1.2–2.2)
Albumin: 4.4 g/dL (ref 3.8–4.9)
Alkaline Phosphatase: 141 IU/L — ABNORMAL HIGH (ref 44–121)
BUN/Creatinine Ratio: 13 (ref 9–23)
BUN: 9 mg/dL (ref 6–24)
Bilirubin Total: 0.3 mg/dL (ref 0.0–1.2)
CO2: 23 mmol/L (ref 20–29)
Calcium: 9.3 mg/dL (ref 8.7–10.2)
Chloride: 104 mmol/L (ref 96–106)
Creatinine, Ser: 0.72 mg/dL (ref 0.57–1.00)
Globulin, Total: 2.6 g/dL (ref 1.5–4.5)
Glucose: 86 mg/dL (ref 70–99)
Potassium: 4.3 mmol/L (ref 3.5–5.2)
Sodium: 141 mmol/L (ref 134–144)
Total Protein: 7 g/dL (ref 6.0–8.5)
eGFR: 99 mL/min/{1.73_m2} (ref >59)

## 2022-01-27 LAB — TSH: TSH: 1.54 u[IU]/mL (ref 0.450–4.500)

## 2022-01-27 LAB — LIPID PANEL
Chol/HDL Ratio: 3.3 ratio (ref 0.0–4.4)
Cholesterol, Total: 264 mg/dL — ABNORMAL HIGH (ref 100–199)
HDL: 81 mg/dL (ref >39)
LDL Chol Calc (NIH): 172 mg/dL — ABNORMAL HIGH (ref 0–99)
Triglycerides: 70 mg/dL (ref 0–149)
VLDL Cholesterol Cal: 11 mg/dL (ref 5–40)

## 2022-01-27 LAB — IRON,TIBC AND FERRITIN PANEL
Ferritin: 90 ng/mL (ref 15–150)
Iron Saturation: 26 % (ref 15–55)
Iron: 85 ug/dL (ref 27–159)
Total Iron Binding Capacity: 321 ug/dL (ref 250–450)
UIBC: 236 ug/dL (ref 131–425)

## 2022-01-27 LAB — HEMOGLOBIN A1C
Est. average glucose Bld gHb Est-mCnc: 114 mg/dL
Hgb A1c MFr Bld: 5.6 % (ref 4.8–5.6)

## 2022-01-27 LAB — VITAMIN D 25 HYDROXY (VIT D DEFICIENCY, FRACTURES): Vit D, 25-Hydroxy: 22.5 ng/mL — ABNORMAL LOW (ref 30.0–100.0)

## 2022-02-02 ENCOUNTER — Other Ambulatory Visit: Payer: Self-pay | Admitting: Nurse Practitioner

## 2022-02-02 DIAGNOSIS — E7800 Pure hypercholesterolemia, unspecified: Secondary | ICD-10-CM

## 2022-02-02 MED ORDER — ATORVASTATIN CALCIUM 20 MG PO TABS: 20.0000 mg | ORAL_TABLET | Freq: Every day | ORAL | 2 refills | Status: DC

## 2022-02-02 MED ORDER — VITAMIN D (ERGOCALCIFEROL) 1.25 MG (50000 UNIT) PO CAPS: 50000.0000 [IU] | ORAL_CAPSULE | ORAL | 1 refills | Status: DC

## 2022-02-11 ENCOUNTER — Ambulatory Visit (INDEPENDENT_AMBULATORY_CARE_PROVIDER_SITE_OTHER): Payer: No Typology Code available for payment source | Admitting: Psychology

## 2022-02-11 DIAGNOSIS — F4321 Adjustment disorder with depressed mood: Secondary | ICD-10-CM | POA: Diagnosis not present
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
## 2022-02-17 ENCOUNTER — Other Ambulatory Visit: Payer: Self-pay

## 2022-02-17 ENCOUNTER — Telehealth: Payer: Self-pay

## 2022-02-17 MED ORDER — ESCITALOPRAM OXALATE 10 MG PO TABS: ORAL_TABLET | ORAL | 1 refills | Status: DC

## 2022-03-11 ENCOUNTER — Ambulatory Visit (INDEPENDENT_AMBULATORY_CARE_PROVIDER_SITE_OTHER): Payer: No Typology Code available for payment source | Admitting: Psychology

## 2022-03-11 DIAGNOSIS — F4321 Adjustment disorder with depressed mood: Secondary | ICD-10-CM | POA: Diagnosis not present

## 2022-03-11 NOTE — Progress Notes (Signed)
[place] Counselor/Therapist Progress Note  Patient ID: Wendy Pineda, MRN: [id number],    Date: 03/11/2022  Time Spent: 45 mins  Treatment Type: Individual Therapy  Reported Symptoms: Pt presents for today's session, via phone due to no internet at her home.  Pt grants consent for session, stating that she is in her home with no one else present.  I shared with pt that I am in my office with no one else here with me either.     Mental Status Exam: Appearance:  Casual     Behavior: Appropriate  Motor: Normal  Speech/Language:  Clear and Coherent  Affect: Appropriate  Mood: normal  Thought process: normal  Thought content:   WNL  Sensory/Perceptual disturbances:   WNL  Orientation: oriented to person, place, and time/date  Attention: Good  Concentration: Good  Memory: WNL  Fund of knowledge:  Good  Insight:   Good  Judgment:  Good  Impulse Control: Good   Risk Assessment: Danger to Self:  No Self-injurious Behavior: No Danger to Others: No Duty to Warn:no Physical Aggression / Violence:No  Access to Firearms a concern: No  Gang Involvement:No   Subjective: Pt shares that she "has been up and down since our last session.  I got called back into court last week and I got a bill for $6000.00 from my attorney.  She said she would not finish my case unless I paid her.  I am so disappointed in the justice system here and how it has served me."  Pt feels like she cannot trust her attorney and she feels like the attorney is not supporting her interests.  Pt shares that she has been struggling to financially support herself right now because she has still not received any money as a result of the finalization of the court case.  "I am doing everything I know to do, but I just feel like I cannot move forward."  Pt shares she went to the beach with her mom, her sister, and her aunt for Thanksgiving; "we had a ball; it was a great time with everyone."  Pt shares that her  car stopped working and [name] came up to [place] to see her before she went to [employer] and they had a nice visit; he lent her his car to drive to [place] so she could ride to [place] with her mom and sister.  She replaced her battery, with [name]'s help, after she got home.   Pt would like to put up her Christmas tree soon.  She is also planning to go to [facility] to see her family for Christmas as well.  Pt shares that she has been praying, reading her Bible, and listening to sermons on line.  Pt shares she wants to continue to go for walks as a means of self care.  Encouraged pt to continue with her self care activities and we will meet in 4 wks a follow up session.  Interventions: Cognitive Behavioral Therapy  Diagnosis:Adjustment disorder with depressed mood  Plan: Treatment Plan Strengths/Abilities:  Intelligent, Intuitive, Willing to participate in therapy Treatment Preferences:  Outpatient Individual Therapy Statement of Needs:  Patient is to use CBT, mindfulness and coping skills to help manage and/or decrease symptoms associated with their diagnosis. Symptoms:  Depressed/Irritable mood, worry, social withdrawal Problems Addressed:  Depressive thoughts, Sadness, Sleep issues, etc. Long Term Goals:  Pt to reduce overall level, frequency, and intensity of the feelings of depression as evidenced by decreased irritability,  negative self talk, and helpless feelings from 6 to 7 days/week to 0 to 1 days/week, per client report, for at least 3 consecutive months.  Progress: 20% Short Term Goals:  Pt to verbally express understanding of the relationship between feelings of depression and their impact on thinking patterns and behaviors.  Pt to verbalize an understanding of the role that distorted thinking plays in creating fears, excessive worry, and ruminations.  Progress: 20% Target Date:  03/19/2022 Frequency:  Weekly Modality:  Cognitive Behavioral Therapy Interventions by Therapist:  Therapist will use  CBT, Mindfulness exercises, Coping skills and Referrals, as needed by client.   Ivan Anchors, [facility]

## 2022-04-08 ENCOUNTER — Other Ambulatory Visit: Payer: Self-pay

## 2022-04-08 ENCOUNTER — Ambulatory Visit (INDEPENDENT_AMBULATORY_CARE_PROVIDER_SITE_OTHER): Payer: Self-pay | Admitting: Psychology

## 2022-04-08 DIAGNOSIS — F4321 Adjustment disorder with depressed mood: Secondary | ICD-10-CM

## 2022-04-08 NOTE — Progress Notes (Signed)
[place] Counselor/Therapist Progress Note  Patient ID: AZARIA STEGMAN, MRN: [id number],    Date: 04/08/2022  Time Spent: 45 mins  Treatment Type: Individual Therapy  Reported Symptoms: Pt presents for today's session, via phone due to no internet at her home.  Pt grants consent for session, stating that she is in her home with no one else present.  I shared with pt that I am in my office with no one else here with me either.     Mental Status Exam: Appearance:  Casual     Behavior: Appropriate  Motor: Normal  Speech/Language:  Clear and Coherent  Affect: Appropriate  Mood: normal  Thought process: normal  Thought content:   WNL  Sensory/Perceptual disturbances:   WNL  Orientation: oriented to person, place, and time/date  Attention: Good  Concentration: Good  Memory: WNL  Fund of knowledge:  Good  Insight:   Good  Judgment:  Good  Impulse Control: Good   Risk Assessment: Danger to Self:  No Self-injurious Behavior: No Danger to Others: No Duty to Warn:no Physical Aggression / Violence:No  Access to Firearms a concern: No  Gang Involvement:No   Subjective: Pt shares that she "has been doing pretty good since our last session.  My friend Lynelle Smoke, who I met in court, had a court date today and I was planning to go with her, but I could not make it.  I went home (to [facility]) for Christmas and New Years."  Pt shares that she had trouble sleeping last night.  Pt shares she shared Christmas Eve with her mom, Zenaida Deed and his girlfriend, and her sisters in [place].  They had a good time together.  She also visited with her aunts and uncles as well over the holidays.  Pt shares, "There is no update yet about what is going on with the house.  It is still not on the market.  [name] and I are hoping to turn in the paperwork for the loan to buy the house by Friday."  Pt shares that she continues to have a lot of clutter in her home and she is trying to work on it to make it better.   Pt shares she still has not received any money from her ex-husband that was due to her from the settlement in court.  Pt feels like she cannot trust her attorney and she feels like the attorney is not supporting her interests.  Pt shares that she has been praying, reading her Bible, and listening to sermons on line.  Pt shares she wants to continue to go for walks as a means of self care.  Encouraged pt to continue with her self care activities and we will meet in 4 wks a follow up session.  Interventions: Cognitive Behavioral Therapy  Diagnosis:Adjustment disorder with depressed mood  Plan: Treatment Plan Strengths/Abilities:  Intelligent, Intuitive, Willing to participate in therapy Treatment Preferences:  Outpatient Individual Therapy Statement of Needs:  Patient is to use CBT, mindfulness and coping skills to help manage and/or decrease symptoms associated with their diagnosis. Symptoms:  Depressed/Irritable mood, worry, social withdrawal Problems Addressed:  Depressive thoughts, Sadness, Sleep issues, etc. Long Term Goals:  Pt to reduce overall level, frequency, and intensity of the feelings of depression as evidenced by decreased irritability, negative self talk, and helpless feelings from 6 to 7 days/week to 0 to 1 days/week, per client report, for at least 3 consecutive months.  Progress: 20% Short Term Goals:  Pt to verbally  express understanding of the relationship between feelings of depression and their impact on thinking patterns and behaviors.  Pt to verbalize an understanding of the role that distorted thinking plays in creating fears, excessive worry, and ruminations.  Progress: 20% Target Date:  03/20/2023 Frequency:  Weekly Modality:  Cognitive Behavioral Therapy Interventions by Therapist:  Therapist will use CBT, Mindfulness exercises, Coping skills and Referrals, as needed by client.   Ivan Anchors, [facility]

## 2022-04-13 NOTE — Telephone Encounter (Signed)
Chmg-error.  

## 2022-04-14 ENCOUNTER — Other Ambulatory Visit: Payer: No Typology Code available for payment source

## 2022-04-22 ENCOUNTER — Other Ambulatory Visit: Payer: No Typology Code available for payment source

## 2022-05-05 ENCOUNTER — Ambulatory Visit (INDEPENDENT_AMBULATORY_CARE_PROVIDER_SITE_OTHER): Payer: Self-pay | Admitting: Psychology

## 2022-05-05 DIAGNOSIS — F4321 Adjustment disorder with depressed mood: Secondary | ICD-10-CM

## 2022-05-05 NOTE — Progress Notes (Signed)
[place] Counselor/Therapist Progress Note  Patient ID: Wendy Pineda, MRN: [id number],    Date: 05/05/2022  Time Spent: 45 mins  Treatment Type: Individual Therapy  Reported Symptoms: Pt presents for today's session, via phone due to no internet at her home.  Pt grants consent for session, stating that she is in her home with no one else present.  I shared with pt that I am in my office with no one else here with me either.     Mental Status Exam: Appearance:  Casual     Behavior: Appropriate  Motor: Normal  Speech/Language:  Clear and Coherent  Affect: Appropriate  Mood: normal  Thought process: normal  Thought content:   WNL  Sensory/Perceptual disturbances:   WNL  Orientation: oriented to person, place, and time/date  Attention: Good  Concentration: Good  Memory: WNL  Fund of knowledge:  Good  Insight:   Good  Judgment:  Good  Impulse Control: Good   Risk Assessment: Danger to Self:  No Self-injurious Behavior: No Danger to Others: No Duty to Warn:no Physical Aggression / Violence:No  Access to Firearms a concern: No  Gang Involvement:No   Subjective: Pt shares that she "has had a lot going on since our last session.  I kept feeling like my ex's attorney was going to charge me with contempt.  I sincerely believe the two attorneys were working together against me.  My ex called me a couple of weeks ago and asked me if my attorney had prepared the [employer]) and I told him that she had not done so.  I do not trust my ex.  He started 'liking' some of my posts on social media and I blocked him; he then came back with a different account and liked some other posts so I blocked him again.  He is continuing to make the mortgage payments while I wait for the settlement money."  Pt shares she is very frustrated with her attorney for not finishing the financial pieces of the agreement."  Pt shares this situation is so stressful for her  and she just wants it to be over.  She is so stressed out that she started bleeding from her rectum this past weekend.  The bleeding has stopped and she is feeling better.  [name] is also being helpful to pt as well and she appreciates that from him.  Pt continues to have difficulty sleeping due to all of the stress she is under.  Pt shares that she has continued praying, reading her Bible, and listening to sermons on line.  Pt shares she wants to continue to go for walks as a means of self care.  Encouraged pt to continue with her self care activities and we will meet in 4 wks a follow up session.  Interventions: Cognitive Behavioral Therapy  Diagnosis:Adjustment disorder with depressed mood  Plan: Treatment Plan Strengths/Abilities:  Intelligent, Intuitive, Willing to participate in therapy Treatment Preferences:  Outpatient Individual Therapy Statement of Needs:  Patient is to use CBT, mindfulness and coping skills to help manage and/or decrease symptoms associated with their diagnosis. Symptoms:  Depressed/Irritable mood, worry, social withdrawal Problems Addressed:  Depressive thoughts, Sadness, Sleep issues, etc. Long Term Goals:  Pt to reduce overall level, frequency, and intensity of the feelings of depression as evidenced by decreased irritability, negative self talk, and helpless feelings from 6 to 7 days/week to 0 to 1 days/week, per client report, for at least 3 consecutive months.  Progress: 20% Short Term Goals:  Pt to verbally express understanding of the relationship between feelings of depression and their impact on thinking patterns and behaviors.  Pt to verbalize an understanding of the role that distorted thinking plays in creating fears, excessive worry, and ruminations.  Progress: 20% Target Date:  03/20/2023 Frequency:  Weekly Modality:  Cognitive Behavioral Therapy Interventions by Therapist:  Therapist will use CBT, Mindfulness exercises, Coping skills and Referrals, as needed  by client.   Ivan Anchors, [facility]

## 2022-05-26 ENCOUNTER — Ambulatory Visit (INDEPENDENT_AMBULATORY_CARE_PROVIDER_SITE_OTHER): Payer: Self-pay | Admitting: Psychology

## 2022-05-26 DIAGNOSIS — F4321 Adjustment disorder with depressed mood: Secondary | ICD-10-CM

## 2022-05-26 NOTE — Progress Notes (Signed)
[place] Counselor/Therapist Progress Note  Patient ID: CECELIA BUTTA, MRN: [id number],    Date: 05/26/2022  Time Spent: 45 mins  Treatment Type: Individual Therapy  Reported Symptoms: Pt presents for today's session, via phone due to no internet at her home.  Pt grants consent for session, stating that she is in her car with no one else present.  I shared with pt that I am in my office with no one else here with me either.     Mental Status Exam: Appearance:  Casual     Behavior: Appropriate  Motor: Normal  Speech/Language:  Clear and Coherent  Affect: Appropriate  Mood: normal  Thought process: normal  Thought content:   WNL  Sensory/Perceptual disturbances:   WNL  Orientation: oriented to person, place, and time/date  Attention: Good  Concentration: Good  Memory: WNL  Fund of knowledge:  Good  Insight:   Good  Judgment:  Good  Impulse Control: Good   Risk Assessment: Danger to Self:  No Self-injurious Behavior: No Danger to Others: No Duty to Warn:no Physical Aggression / Violence:No  Access to Firearms a concern: No  Gang Involvement:No   Subjective: Pt shares that she "has been doing pretty good since our last session.  I went to see my mom last weekend for a Super Bowl party and stayed a couple of more days.  We had a good time together."  Pt shares that she tried to encourage her mom.  [name] came to see pt with his girlfriend this past weekend.  Pt shares she still needs help de-cluttering her home with organizing things in the home.  She has been working towards the de-cluttering project in her home too.  [name] stayed with his dad last weekend and his dad (pt's ex-husband) was willing to do what he needed to do to allow pt to be able to keep the house.  She states she still has not gotten the money that she is due from the property settlement for their divorce.  She shares that her attorney has still not done the work she needs to do to get the money.  Pt  is planning to go by her attorney's office today to try to find out why she has not gotten her money that she is due.  She continues to seek support from her prayer group and she appreciates them.  Pt is going to start re-reading her copy of "Co-dependent No More" which she believes will her helpful for her.  Pt shares that she has continued praying, reading her Bible, and listening to sermons on line.  Pt shares she wants to continue to go for walks as a means of self care.  Encouraged pt to continue with her self care activities and we will meet in 4 wks a follow up session.  Interventions: Cognitive Behavioral Therapy  Diagnosis:Adjustment disorder with depressed mood  Plan: Treatment Plan Strengths/Abilities:  Intelligent, Intuitive, Willing to participate in therapy Treatment Preferences:  Outpatient Individual Therapy Statement of Needs:  Patient is to use CBT, mindfulness and coping skills to help manage and/or decrease symptoms associated with their diagnosis. Symptoms:  Depressed/Irritable mood, worry, social withdrawal Problems Addressed:  Depressive thoughts, Sadness, Sleep issues, etc. Long Term Goals:  Pt to reduce overall level, frequency, and intensity of the feelings of depression as evidenced by decreased irritability, negative self talk, and helpless feelings from 6 to 7 days/week to 0 to 1 days/week, per client report, for at least 3 consecutive  months.  Progress: 20% Short Term Goals:  Pt to verbally express understanding of the relationship between feelings of depression and their impact on thinking patterns and behaviors.  Pt to verbalize an understanding of the role that distorted thinking plays in creating fears, excessive worry, and ruminations.  Progress: 20% Target Date:  03/20/2023 Frequency:  Weekly Modality:  Cognitive Behavioral Therapy Interventions by Therapist:  Therapist will use CBT, Mindfulness exercises, Coping skills and Referrals, as needed by client.  Ivan Anchors, [facility]

## 2022-07-15 ENCOUNTER — Ambulatory Visit (INDEPENDENT_AMBULATORY_CARE_PROVIDER_SITE_OTHER): Payer: Self-pay | Admitting: Nurse Practitioner

## 2022-07-15 ENCOUNTER — Encounter: Payer: Self-pay | Admitting: Nurse Practitioner

## 2022-07-15 ENCOUNTER — Telehealth: Payer: Self-pay

## 2022-07-15 VITALS — BP 110/76 | HR 96 | Temp 98.1°F | Ht 66.0 in | Wt 180.0 lb

## 2022-07-15 DIAGNOSIS — K625 Hemorrhage of anus and rectum: Secondary | ICD-10-CM

## 2022-07-15 DIAGNOSIS — R109 Unspecified abdominal pain: Secondary | ICD-10-CM | POA: Insufficient documentation

## 2022-07-15 DIAGNOSIS — E7800 Pure hypercholesterolemia, unspecified: Secondary | ICD-10-CM

## 2022-07-15 DIAGNOSIS — R002 Palpitations: Secondary | ICD-10-CM | POA: Insufficient documentation

## 2022-07-15 DIAGNOSIS — F419 Anxiety disorder, unspecified: Secondary | ICD-10-CM

## 2022-07-15 DIAGNOSIS — F32A Depression, unspecified: Secondary | ICD-10-CM | POA: Insufficient documentation

## 2022-07-15 LAB — POCT URINALYSIS DIPSTICK
Bilirubin, UA: NEGATIVE
Blood, UA: NEGATIVE
Glucose, UA: NEGATIVE
Ketones, UA: NEGATIVE
Leukocytes, UA: NEGATIVE
Nitrite, UA: NEGATIVE
Protein, UA: NEGATIVE
Spec Grav, UA: >=1.03 — AB (ref 1.010–1.025)
Urobilinogen, UA: 0.2 E.U./dL
pH, UA: 5.5 (ref 5.0–8.0)

## 2022-07-15 NOTE — Patient Instructions (Signed)

## 2022-07-15 NOTE — Telephone Encounter (Signed)
Received STD paperwork from [facility]. Per Lolita Cram she would prefer the patient's mental health provider complete these forms. Patient has not been seen in office since 01/2022.  Spoke with patient and advised. She has OV scheduled here this afternoon.

## 2022-07-15 NOTE — Progress Notes (Signed)
I,Sheena Holbrook,acting as a Neurosurgeonscribe for Wendy FeltsJanece Shateka Petrea, FNP.,have documented all relevant documentation on the behalf of Wendy FeltsJanece Wendy Cartlidge, FNP,as directed by  Wendy FeltsJanece Wendy Herrada, FNP while in the presence of Wendy FeltsJanece Wendy Becherer, FNP.    Subjective:     Patient ID: Wendy Pineda , female    DOB: 07/06/1965 , 57 y.o.   MRN: [id number]   Chief Complaint  Patient presents with   Rectal Bleeding    HPI  Patient presents today to discuss rectal bleeding in February.  She reports not being seen for her rectal bleeding, she was directed by after hours person to go to Urgent care but she never went she states she just laid around the whole weekend and the following week she didn't notice any rectal bleeding. Patient reports not currently being sexual active. States "I was a little bit scared" is why she did not go to ER. She is due for her colonoscopy in 2027. She did have polyps removed and is due for colonoscopy in 5 years. Her grandfather had colon cancer. During that week she ate beans all week. She had blood on her underwear and tissue. No additional symptoms or blood. She denies constipation or straining.   She report having back pain and her balance was off. State "sometimes I don't feel like I am all together". She has been having pain to her lower back. She has an ache to her low back. She is not exercising or stretching. States "the pain can come out of nowhere". She has not taken any pain medications. States "I am praying for it to go away".  She has darkened urine since taking a sea moss gummi. She is drinking approximately 64 oz water a day. She is unsure if related to stress. She is also having headaches. She reports now a slight headache.    She also reports hip pain. She is having intermittent sharp pain to her hip. She is doing herbal supplements for inflammation.   BP Readings from Last 3 Encounters: 07/15/22 : 110/76 01/26/22 : 120/62 08/13/21 : 128/72  The 10-year ASCVD risk score (Arnett DK, et al.,  2019) is: 1.5%   Values used to calculate the score:     Age: [age] years     Sex: Female     Is Non-Hispanic African American: Yes     Diabetic: No     Tobacco smoker: No     Systolic Blood Pressure: 110 mmHg     Is BP treated: No     HDL Cholesterol: 81 mg/dL     Total Cholesterol: 264 mg/dL  She no longer has insurance.  She is not currently taking her atorvastatin, she never picked up the medications.      Past Medical History:  Diagnosis Date   Anemia    Anxiety    on meds   Arthritis    bilateral knees   Carotid bruit    carotid dopplers 12/22/2010, no hemodynamically significant stenosis   Depression    on meds   GERD (gastroesophageal reflux disease)    occasional-tums prn-overindulgence   Heart murmur    echo 10/11/2007 EF >55%mild, MR   Hx of chest pain    neg. lexiscan myoview 02/23/2011   Hyperlipidemia    diet controlled   Sickle cell trait (HCC)      Family History  Problem Relation Age of Onset   Colon polyps Mother 4572   Breast cancer Mother    Stroke Maternal Grandmother  Colon polyps Maternal Grandfather 72   Colon cancer Maternal Grandfather 72   Esophageal cancer Neg Hx    Rectal cancer Neg Hx    Stomach cancer Neg Hx      Current Outpatient Medications:    Ascorbic Acid (VITAMIN C PO), Take 1 tablet by mouth daily at 12 noon., Disp: , Rfl:    atorvastatin (LIPITOR) 20 MG tablet, Take 1 tablet (20 mg total) by mouth daily., Disp: 30 tablet, Rfl: 2   B Complex Vitamins (B COMPLEX PO), Take 1 packet by mouth daily. Reported on 06/27/2015, Disp: , Rfl:    COLLAGEN PO, Take 1 Scoop by mouth every other day., Disp: , Rfl:    escitalopram (LEXAPRO) 10 MG tablet, TAKE 1 TABLET(10 MG) BY MOUTH DAILY, Disp: 30 tablet, Rfl: 1   Magnesium 250 MG TABS, Take 1 tablet (250 mg total) by mouth every evening., Disp: 90 tablet, Rfl: 0   Multiple Vitamin (MULTIVITAMIN PO), Take 1 tablet by mouth daily at 2 am., Disp: , Rfl:    VITAMIN D PO, Take 1 tablet by  mouth daily., Disp: , Rfl:    Vitamin D, Ergocalciferol, (DRISDOL) 1.25 MG (50000 UNIT) CAPS capsule, Take 1 capsule (50,000 Units total) by mouth once a week., Disp: 12 capsule, Rfl: 1   Multiple Vitamins-Minerals (ZINC PO), Take 1 tablet by mouth daily at 12 noon. (Patient not taking: Reported on 03/17/2021), Disp: , Rfl:    vortioxetine HBr (TRINTELLIX) 5 MG TABS tablet, Take 5 mg by mouth daily. (Patient not taking: Reported on 01/26/2022), Disp: , Rfl:    Allergies  Allergen Reactions   Sulfa Antibiotics Hives, Swelling and Rash   Elemental Sulfur Hives, Swelling and Rash     Review of Systems  Constitutional: Negative.   HENT: Negative.    Eyes: Negative.   Respiratory: Negative.    Cardiovascular: Negative.   Gastrointestinal: Negative.   Endocrine: Negative.   Genitourinary: Negative.   Allergic/Immunologic: Negative.  Negative for environmental allergies.  Neurological: Negative.   Psychiatric/Behavioral: Negative.       Today's Vitals   07/15/22 1438  BP: 110/76  Pulse: 96  Temp: 98.1 F (36.7 C)  TempSrc: Oral  Weight: 180 lb (81.6 kg)  Height: 5\' 6"  (1.676 m)  PainSc: 0-No pain   Body mass index is 29.05 kg/m.  Wt Readings from Last 3 Encounters:  07/15/22 180 lb (81.6 kg)  01/26/22 172 lb (78 kg)  08/13/21 179 lb (81.2 kg)    Objective:  Physical Exam Vitals reviewed.  Constitutional:      General: She is not in acute distress.    Appearance: Normal appearance.  Cardiovascular:     Rate and Rhythm: Normal rate and regular rhythm.     Pulses: Normal pulses.     Heart sounds: Normal heart sounds. No murmur heard. Pulmonary:     Effort: Pulmonary effort is normal. No respiratory distress.     Breath sounds: Normal breath sounds. No wheezing.  Neurological:     Mental Status: She is alert.  Psychiatric:        Mood and Affect: Mood normal.        Behavior: Behavior normal.        Thought Content: Thought content normal.        Judgment: Judgment  normal.         Assessment And Plan:     1. Rectal bleeding Comments: Has not had any further episodes.  Advised if this occurs  again that she needs to be seen sooner rather than either in may go to the urgency of admit for  2. Anxiety and depression Comments: Awaiting message from Mr. Cottle on her return to work status  3. Hypercholesteremia Comments: Not taking medications, will check Cardiac score - CT CARDIAC SCORING (SELF PAY ONLY); Future - Ambulatory referral to Cardiology  4. Palpitations Comments: Will refer to Cardiology due to continued feelings of palpitations. May be related to anxiety however needs to have a cardiac workup - Ambulatory referral to Cardiology  5. Flank pain Comments: Negative for CVA tenderness bilaterally.  Urine now this is normal. - POCT Urinalysis Dipstick (46962)     Patient was given opportunity to ask questions. Patient verbalized understanding of the plan and was able to repeat key elements of the plan. All questions were answered to their satisfaction.  Wendy Felts, FNP   I, Wendy Felts, FNP, have reviewed all documentation for this visit. The documentation on 07/15/22 for the exam, diagnosis, procedures, and orders are all accurate and complete.   IF YOU HAVE BEEN REFERRED TO A SPECIALIST, IT MAY TAKE 1-2 WEEKS TO SCHEDULE/PROCESS THE REFERRAL. IF YOU HAVE NOT HEARD FROM US/SPECIALIST IN TWO WEEKS, PLEASE GIVE [place] A CALL AT [phone].   THE PATIENT IS ENCOURAGED TO PRACTICE SOCIAL DISTANCING DUE TO THE COVID-19 PANDEMIC.

## 2022-07-30 ENCOUNTER — Ambulatory Visit (INDEPENDENT_AMBULATORY_CARE_PROVIDER_SITE_OTHER): Payer: Self-pay | Admitting: Psychology

## 2022-07-30 DIAGNOSIS — F4321 Adjustment disorder with depressed mood: Secondary | ICD-10-CM

## 2022-07-30 NOTE — Progress Notes (Signed)
[facility] Counselor/Therapist Progress Note  Patient ID: Wendy Pineda, MRN: [id number],    Date: 07/30/2022  Time Spent: 45 mins  Treatment Type: Individual Therapy  Reported Symptoms: Pt presents for today's session, via phone due to no internet at her home.  Pt grants consent for session, stating that she is in her home with no one else present.  I shared with pt that I am in my office with no one else here with me either.     Mental Status Exam: Appearance:  Casual     Behavior: Appropriate  Motor: Normal  Speech/Language:  Clear and Coherent  Affect: Appropriate  Mood: normal  Thought process: normal  Thought content:   WNL  Sensory/Perceptual disturbances:   WNL  Orientation: oriented to person, place, and time/date  Attention: Good  Concentration: Good  Memory: WNL  Fund of knowledge:  Good  Insight:   Good  Judgment:  Good  Impulse Control: Good   Risk Assessment: Danger to Self:  No Self-injurious Behavior: No Danger to Others: No Duty to Warn:no Physical Aggression / Violence:No  Access to Firearms a concern: No  Gang Involvement:No   Subjective: Pt shares that "it has been a long couple of months since we met last time.  I did not have health insurance but I have now gotten it back.  Back in February, I had a real feeling of heaviness in my spirit because of the insecurity about my home.  In March, I contacted my sister about what we should do for my mom's birthday on 4/6.  She did not respond and then my aunt (mom's sister) wanted to know what we were going to do to celebrate my mom's birthday.  I showed her that I was trying to to get feedback from my sister.  I ended up reaching out to my friend to make a cake for my mom; I was so into my feelings about this situation with my sister that I ended up being late for the birthday dinner for my mom."  Pt shares she is so frustrated by her dealings with her sister.  Pt did enjoy the events on Friday and  Saturday for her mom's birthday; she was late for the Sat event as well "because I was back in my own feelings about everything."  Pt shares that [name] and Lilly came up to celebrate my birthday; they went to parks and then went to [employer] for dinner and she enjoyed the time with them.  Pt shares that [name] and his dad are talking about how to get the house in pt's name; her ex-husband continues to make the payments on the home but has not made the April payment yet.  Pt shares that she and [name] signed their parts of the mortgage information at the attorney's office yesterday.  She is hopeful that her ex-husband will sign his documents this next week.  Pt shares that she has not received any money from her ex-husband yet, even though she was awarded monies from his retirement account.  She is planning to try to find another attorney who can give her legal advice (possibly [employer]) after Mr. Hew signs his part of the mortgage papers.  Pt shares that she has continued praying, reading her Bible, and listening to sermons on line.  Pt shares she wants to continue to go for walks as a means of self care.  She also bought a new book to help her heal  from her divorce and all the subsequent difficulties.  Encouraged pt to continue with her self care activities and we will meet in 4 wks a follow up session.  Interventions: Cognitive Behavioral Therapy  Diagnosis:Adjustment disorder with depressed mood  Plan: Treatment Plan Strengths/Abilities:  Intelligent, Intuitive, Willing to participate in therapy Treatment Preferences:  Outpatient Individual Therapy Statement of Needs:  Patient is to use CBT, mindfulness and coping skills to help manage and/or decrease symptoms associated with their diagnosis. Symptoms:  Depressed/Irritable mood, worry, social withdrawal Problems Addressed:  Depressive thoughts, Sadness, Sleep issues, etc. Long Term Goals:  Pt to reduce overall level, frequency, and intensity of  the feelings of depression as evidenced by decreased irritability, negative self talk, and helpless feelings from 6 to 7 days/week to 0 to 1 days/week, per client report, for at least 3 consecutive months.  Progress: 20% Short Term Goals:  Pt to verbally express understanding of the relationship between feelings of depression and their impact on thinking patterns and behaviors.  Pt to verbalize an understanding of the role that distorted thinking plays in creating fears, excessive worry, and ruminations.  Progress: 20% Target Date:  03/20/2023 Frequency:  Weekly Modality:  Cognitive Behavioral Therapy Interventions by Therapist:  Therapist will use CBT, Mindfulness exercises, Coping skills and Referrals, as needed by client.  Karie Kirks, [facility]

## 2022-08-03 ENCOUNTER — Encounter (HOSPITAL_COMMUNITY): Payer: Self-pay

## 2022-08-03 ENCOUNTER — Ambulatory Visit (HOSPITAL_COMMUNITY)
Admission: RE | Admit: 2022-08-03 | Discharge: 2022-08-03 | Disposition: A | Discharge status: Home / Self Care | Payer: Self-pay | Source: Ambulatory Visit | Attending: Nurse Practitioner | Admitting: Nurse Practitioner

## 2022-08-03 DIAGNOSIS — E7800 Pure hypercholesterolemia, unspecified: Secondary | ICD-10-CM | POA: Insufficient documentation

## 2022-08-26 ENCOUNTER — Ambulatory Visit (INDEPENDENT_AMBULATORY_CARE_PROVIDER_SITE_OTHER): Payer: Self-pay | Admitting: Psychology

## 2022-08-26 DIAGNOSIS — F4321 Adjustment disorder with depressed mood: Secondary | ICD-10-CM

## 2022-08-26 NOTE — Progress Notes (Signed)
[facility] Counselor/Therapist Progress Note  Patient ID: Wendy Pineda, MRN: [id number],    Date: 08/26/2022  Time Spent: 45 mins  Treatment Type: Individual Therapy  Reported Symptoms: Pt presents for today's session, via phone due to no internet at her home.  Pt grants consent for session, stating that she is in her home with no one else present.  I shared with pt that I am in my office with no one else here with me either.     Mental Status Exam: Appearance:  Casual     Behavior: Appropriate  Motor: Normal  Speech/Language:  Clear and Coherent  Affect: Appropriate  Mood: normal  Thought process: normal  Thought content:   WNL  Sensory/Perceptual disturbances:   WNL  Orientation: oriented to person, place, and time/date  Attention: Good  Concentration: Good  Memory: WNL  Fund of knowledge:  Good  Insight:   Good  Judgment:  Good  Impulse Control: Good   Risk Assessment: Danger to Self:  No Self-injurious Behavior: No Danger to Others: No Duty to Warn:no Physical Aggression / Violence:No  Access to Firearms a concern: No  Gang Involvement:No   Subjective: Pt shares that "there has been a lot going on since our last session.  My son finally bought the house from my ex-husband and that feels so much better.  My ex was tough but my closing attorney took care of it.  The inspector that came to evaluate the house was very helpful.  I need to do some work on the house so I am going to have to pursue my portion of his pension and 401K in order to get this work done."  Pt shares she is so thankful that [name] was able to purchase the house for her and she does not have to move out and find a new place to live.  Pt knows there are things that need to be fixed at the house, starting with the roof.  Pt shares that Social Security Disability sent her to another therapist for an evaluation and pt enjoyed that interaction.  She believes she has another appt to be scheduled but  she has to wait for that office to contact her for the appt.  Pt shares that [name] has invited her to get a pass to [place] and she can go with them and enjoy the time together.  Pt shares that she understands that she needs the money from the divorce settlement in order to pay for home improvements and to live on.  Pt shares she did go to [facility] to see her mom and her family for Mother's Day; "I didn't really want to go because I was all in my feelings."  Pt shares that she ended up having a good time and was glad she went.  Pt shares she is continuing to listen to online sermons, following a female minister on TicTok, reading her Bible, and praying for herself and others.  Encouraged pt to continue with her self care activities and we will meet in 5 wks a follow up session.  Interventions: Cognitive Behavioral Therapy  Diagnosis:Adjustment disorder with depressed mood  Plan: Treatment Plan Strengths/Abilities:  Intelligent, Intuitive, Willing to participate in therapy Treatment Preferences:  Outpatient Individual Therapy Statement of Needs:  Patient is to use CBT, mindfulness and coping skills to help manage and/or decrease symptoms associated with their diagnosis. Symptoms:  Depressed/Irritable mood, worry, social withdrawal Problems Addressed:  Depressive thoughts, Sadness, Sleep issues, etc. Long Term  Goals:  Pt to reduce overall level, frequency, and intensity of the feelings of depression as evidenced by decreased irritability, negative self talk, and helpless feelings from 6 to 7 days/week to 0 to 1 days/week, per client report, for at least 3 consecutive months.  Progress: 20% Short Term Goals:  Pt to verbally express understanding of the relationship between feelings of depression and their impact on thinking patterns and behaviors.  Pt to verbalize an understanding of the role that distorted thinking plays in creating fears, excessive worry, and ruminations.  Progress: 20% Target Date:   03/20/2023 Frequency:  Weekly Modality:  Cognitive Behavioral Therapy Interventions by Therapist:  Therapist will use CBT, Mindfulness exercises, Coping skills and Referrals, as needed by client.  Karie Kirks, [facility]

## 2022-09-30 ENCOUNTER — Ambulatory Visit (INDEPENDENT_AMBULATORY_CARE_PROVIDER_SITE_OTHER): Payer: Self-pay | Admitting: Psychology

## 2022-09-30 DIAGNOSIS — F4321 Adjustment disorder with depressed mood: Secondary | ICD-10-CM

## 2022-09-30 NOTE — Progress Notes (Signed)
[facility] Counselor/Therapist Progress Note  Patient ID: Wendy Pineda, MRN: [id number],    Date: 09/30/2022  Time Spent: 60 mins; start time: 1000   end time: 1058  Treatment Type: Individual Therapy  Reported Symptoms: Pt presents for today's session, via phone due to no internet at her home.  Pt grants consent for session, stating that she is in her home with no one else present and also acknowledges the limits of virtual sessions.  I shared with pt that I am in my office with no one else here with me either.     Mental Status Exam: Appearance:  Casual     Behavior: Appropriate  Motor: Normal  Speech/Language:  Clear and Coherent  Affect: Appropriate  Mood: normal  Thought process: normal  Thought content:   WNL  Sensory/Perceptual disturbances:   WNL  Orientation: oriented to person, place, and time/date  Attention: Good  Concentration: Good  Memory: WNL  Fund of knowledge:  Good  Insight:   Good  Judgment:  Good  Impulse Control: Good   Risk Assessment: Danger to Self:  No Self-injurious Behavior: No Danger to Others: No Duty to Warn:no Physical Aggression / Violence:No  Access to Firearms a concern: No  Gang Involvement:No   Subjective: Pt shares that "I have been good since our last session.  There has been a lot going on with me and my family.  My aunt lost her life partner to Parkinson's disease and I went to the funeral to support her.  I stayed with my mom while I was in [place] for the funeral and we had a chance to have some good conversations.  Also, I went to visit [name] and was talking with him and he felt lots of pressure in his relationship with his girlfriend.  They were looking at buying a home but his girlfriend is not working and will not keep a job.  We went to see the home they were looking at purchasing; while we were there, I prayed over my son and the home."  Pt shares that [name] and Lilly did buy the home and they are moved in to the new  home; pt went to help them pack things in their apt and helped them move into the new home.  Pt is concerned for [name] in this relationship; "I don't think she is the right girl for [name]."  Pt shares that she has not been roller skating yet but is trying to work on making it happen.   Pt still has not gotten any money from her ex-husband through their divorce; she knows that she needs to pursue this so she can continue to pursue it.  Pt did have the evaluation from the therapist with Social Security Disability; she has not heard anything from anyone else related to it.  Pt shares she is continuing to listen to online sermons, following a female minister on TicTok, reading her Bible, and praying for herself and others.  Encouraged pt to continue with her self care activities and we will meet in 3 wks a follow up session.  Interventions: Cognitive Behavioral Therapy  Diagnosis:Adjustment disorder with depressed mood  Plan: Treatment Plan Strengths/Abilities:  Intelligent, Intuitive, Willing to participate in therapy Treatment Preferences:  Outpatient Individual Therapy Statement of Needs:  Patient is to use CBT, mindfulness and coping skills to help manage and/or decrease symptoms associated with their diagnosis. Symptoms:  Depressed/Irritable mood, worry, social withdrawal Problems Addressed:  Depressive thoughts, Sadness, Sleep issues,  etc. Long Term Goals:  Pt to reduce overall level, frequency, and intensity of the feelings of depression as evidenced by decreased irritability, negative self talk, and helpless feelings from 6 to 7 days/week to 0 to 1 days/week, per client report, for at least 3 consecutive months.  Progress: 20% Short Term Goals:  Pt to verbally express understanding of the relationship between feelings of depression and their impact on thinking patterns and behaviors.  Pt to verbalize an understanding of the role that distorted thinking plays in creating fears, excessive worry, and  ruminations.  Progress: 20% Target Date:  03/20/2023 Frequency:  Weekly Modality:  Cognitive Behavioral Therapy Interventions by Therapist:  Therapist will use CBT, Mindfulness exercises, Coping skills and Referrals, as needed by client.  Karie Kirks, [facility]

## 2022-10-20 ENCOUNTER — Ambulatory Visit (INDEPENDENT_AMBULATORY_CARE_PROVIDER_SITE_OTHER): Payer: Self-pay | Admitting: Psychology

## 2022-10-20 DIAGNOSIS — F4321 Adjustment disorder with depressed mood: Secondary | ICD-10-CM

## 2022-10-20 NOTE — Progress Notes (Signed)
[facility] Counselor/Therapist Progress Note  Patient ID: Wendy Pineda, MRN: [id number],    Date: 10/20/2022  Time Spent: 45 mins; start time: 1000   end time: 1045  Treatment Type: Individual Therapy  Reported Symptoms: Pt presents for today's session, via phone due to no internet at her home.  Pt grants consent for session, stating that she is in her car with no one else present and also acknowledges the limits of virtual sessions.  I shared with pt that I am in my office with no one else here with me either.     Mental Status Exam: Appearance:  Casual     Behavior: Appropriate  Motor: Normal  Speech/Language:  Clear and Coherent  Affect: Appropriate  Mood: normal  Thought process: normal  Thought content:   WNL  Sensory/Perceptual disturbances:   WNL  Orientation: oriented to person, place, and time/date  Attention: Good  Concentration: Good  Memory: WNL  Fund of knowledge:  Good  Insight:   Good  Judgment:  Good  Impulse Control: Good   Risk Assessment: Danger to Self:  No Self-injurious Behavior: No Danger to Others: No Duty to Warn:no Physical Aggression / Violence:No  Access to Firearms a concern: No  Gang Involvement:No   Subjective: Pt shares that "I have been pretty good since our last session.  I went home to see my mom for the 4th of July and stayed there for several days.  I went to a family dinner at my aunt's home and [name] and Julious Oka came too.  I needed that family time and was thankful for that.  [name] and Lilly are settling into their new home; pt would like to give them a house warming party for her family to give them gifts for the new house."  Pt shares that [name] went with Julious Oka and her family on a camping trip last weekend.  Pt shares a couple of stories about when she was working at [employer] and the enjoyment she got from working with her coworkers there.  She shares she has been reflecting on what her purpose might be moving forward; she has  still not had any follow up from the Social Security Disability folks.  She has gotten a call from the company representing [facility] [employer] [facility] on her claim for long term disability and she has a phone call scheduled with them for next week.  She is continuing to receive monthly payments from the LTD coverage from [facility].  Pt shares she still has not received any of the money from her husband as a result of their court agreement.  Her attorney is still not returning pt's phone calls.  Any attorney friend of the family has suggested pt file a complaint with the [employer] against her attorney since she has not gotten the money yet.  Pt shares that she was supposed to go to the movies yesterday but had to cancel because the landscaper came yesterday to work in her yard.  She is still planning to go roller skating with a friend of hers but they have not made it happen yet.  Pt shares she is continuing to listen to online sermons, following a female minister on TicTok, reading her Bible, and praying for herself and others.  Pt continues to reflect on her circumstance at this time and knows she needs to begin to moving forward.  Pt wants to start by bagging up [name]'s belongings and taking them to [name] in his new home, since he  has space in their new home now.  Encouraged pt to continue with her self care activities and we will meet in 4 wks a follow up session.  Interventions: Cognitive Behavioral Therapy  Diagnosis:Adjustment disorder with depressed mood  Plan: Treatment Plan Strengths/Abilities:  Intelligent, Intuitive, Willing to participate in therapy Treatment Preferences:  Outpatient Individual Therapy Statement of Needs:  Patient is to use CBT, mindfulness and coping skills to help manage and/or decrease symptoms associated with their diagnosis. Symptoms:  Depressed/Irritable mood, worry, social withdrawal Problems Addressed:  Depressive thoughts, Sadness, Sleep issues, etc. Long Term Goals:  Pt to reduce overall  level, frequency, and intensity of the feelings of depression as evidenced by decreased irritability, negative self talk, and helpless feelings from 6 to 7 days/week to 0 to 1 days/week, per client report, for at least 3 consecutive months.  Progress: 20% Short Term Goals:  Pt to verbally express understanding of the relationship between feelings of depression and their impact on thinking patterns and behaviors.  Pt to verbalize an understanding of the role that distorted thinking plays in creating fears, excessive worry, and ruminations.  Progress: 20% Target Date:  03/20/2023 Frequency:  Weekly Modality:  Cognitive Behavioral Therapy Interventions by Therapist:  Therapist will use CBT, Mindfulness exercises, Coping skills and Referrals, as needed by client.  Karie Kirks, [facility]

## 2022-11-19 ENCOUNTER — Ambulatory Visit (INDEPENDENT_AMBULATORY_CARE_PROVIDER_SITE_OTHER): Payer: Self-pay | Admitting: Psychology

## 2022-11-19 DIAGNOSIS — F4321 Adjustment disorder with depressed mood: Secondary | ICD-10-CM

## 2022-11-19 NOTE — Progress Notes (Signed)
[facility] Counselor/Therapist Progress Note  Patient ID: Wendy Pineda, MRN: [id number],    Date: 11/19/2022  Time Spent: 45 mins; start time: 1000   end time: 1045  Treatment Type: Individual Therapy  Reported Symptoms: Pt presents for today's session, via phone due to no internet at her home.  Pt grants consent for session, stating that she is in her home with no one else present and also acknowledges the limits of virtual sessions.  I shared with pt that I am in my office with no one else here with me either.     Mental Status Exam: Appearance:  Casual     Behavior: Appropriate  Motor: Normal  Speech/Language:  Clear and Coherent  Affect: Appropriate  Mood: normal  Thought process: normal  Thought content:   WNL  Sensory/Perceptual disturbances:   WNL  Orientation: oriented to person, place, and time/date  Attention: Good  Concentration: Good  Memory: WNL  Fund of knowledge:  Good  Insight:   Good  Judgment:  Good  Impulse Control: Good   Risk Assessment: Danger to Self:  No Self-injurious Behavior: No Danger to Others: No Duty to Warn:no Physical Aggression / Violence:No  Access to Firearms a concern: No  Gang Involvement:No   Subjective: Pt shares that "I have been good since our last session.  There have been a couple of things that have happened lately.  I ran into my attorney at [employer].  My mom told me not to approach her but I felt I needed to talk to her and find out where my money is.  I saw her in the store and she turned and went the other way.  I continued talking to my mom and she kept telling me to leave the store without talking to her.  I listened to my mom and did not approach her and I left the store."  Pt shares that she thinks that she needs to file for a new trial due to her attorney not doing her job.  Pt still does not have any money from the property settlement from her divorce.  Pt shares that she and her sister were subpoenaed to  court in [place] regarding the disposition of a distant family member's estate.  She may get a little bit of money out of the family member's estate.  Pt shares that they flew in and out of [place] and she stopped to see [name] on her way home and he is doing well.  Pt shares that she continues to work in her yard and she enjoys that; she has more plants and enjoys taking care of them.  Pt shares she is continuing to listen to online sermons, following her favorite female minister on [place], reading her Bible, and praying for herself and others.  Encouraged pt to continue with her self care activities and we will meet in 4 wks a follow up session.  Interventions: Cognitive Behavioral Therapy  Diagnosis:Adjustment disorder with depressed mood  Plan: Treatment Plan Strengths/Abilities:  Intelligent, Intuitive, Willing to participate in therapy Treatment Preferences:  Outpatient Individual Therapy Statement of Needs:  Patient is to use CBT, mindfulness and coping skills to help manage and/or decrease symptoms associated with their diagnosis. Symptoms:  Depressed/Irritable mood, worry, social withdrawal Problems Addressed:  Depressive thoughts, Sadness, Sleep issues, etc. Long Term Goals:  Pt to reduce overall level, frequency, and intensity of the feelings of depression as evidenced by decreased irritability, negative self talk, and helpless feelings from  6 to 7 days/week to 0 to 1 days/week, per client report, for at least 3 consecutive months.  Progress: 20% Short Term Goals:  Pt to verbally express understanding of the relationship between feelings of depression and their impact on thinking patterns and behaviors.  Pt to verbalize an understanding of the role that distorted thinking plays in creating fears, excessive worry, and ruminations.  Progress: 20% Target Date:  03/20/2023 Frequency:  Weekly Modality:  Cognitive Behavioral Therapy Interventions by Therapist:  Therapist will use CBT, Mindfulness  exercises, Coping skills and Referrals, as needed by client.  Karie Kirks, [facility]

## 2022-12-24 ENCOUNTER — Ambulatory Visit (INDEPENDENT_AMBULATORY_CARE_PROVIDER_SITE_OTHER): Payer: Self-pay | Admitting: Psychology

## 2022-12-24 DIAGNOSIS — F4321 Adjustment disorder with depressed mood: Secondary | ICD-10-CM

## 2022-12-24 NOTE — Progress Notes (Signed)
[facility] Counselor/Therapist Progress Note  Patient ID: Wendy Pineda, MRN: [id number],    Date: 12/24/2022  Time Spent: 45 mins; start time: 1000   end time: 1045  Treatment Type: Individual Therapy  Reported Symptoms: Pt presents for today's session, via phone due to no internet at her home.  Pt grants consent for session, stating that she is in her home with no one else present and also acknowledges the limits of virtual sessions.  I shared with pt that I am in my office with no one else here with me either.     Mental Status Exam: Appearance:  Casual     Behavior: Appropriate  Motor: Normal  Speech/Language:  Clear and Coherent  Affect: Appropriate  Mood: normal  Thought process: normal  Thought content:   WNL  Sensory/Perceptual disturbances:   WNL  Orientation: oriented to person, place, and time/date  Attention: Good  Concentration: Good  Memory: WNL  Fund of knowledge:  Good  Insight:   Good  Judgment:  Good  Impulse Control: Good   Risk Assessment: Danger to Self:  No Self-injurious Behavior: No Danger to Others: No Duty to Warn:no Physical Aggression / Violence:No  Access to Firearms a concern: No  Gang Involvement:No   Subjective: Pt shares that "I have been hanging in there, doing pretty good.  A lot has happened; the landscaper has been coming over to help out and I noticed that he seemed to be getting angry with the situation.  I asked him about it and he denied it at first; he finally admitted that sometimes he does get frustrated when it takes me a longer time to make decisions."  Pt also shares that she has noticed that he has been intoxicated some times when he is at her home and that made pt feel uncomfortable; the landscaper also has a Chartered certified accountant because of previous legal issues.  Pt shares that the landscaper is homeless as well.  Pt shares that she has not had any other contact related to her divorce settlement; she has still not  received any of the money from the court settlement.  She said her ex-husband retired ut has gone back to work in the courts since his retirement.  Pt is considering moving out of [place] because of the influence her husband has because of his job.  Pt shares that everyone in [name]'s department at work got laid off 2 wks ago; today is his last day of work.  Pt is going to see [name] this weekend and will spend the night at his home and she is looking forward to the trip.  Pt shares she is continuing to listen to online sermons, she went to the mall yesterday to walk for exercise and is planning to do that at least 3 times per week, following her favorite female minister on TicTok, reading her Bible, and praying for herself and others.  Encouraged pt to continue with her self care activities and we will meet in 4 wks a follow up session.  Interventions: Cognitive Behavioral Therapy  Diagnosis:Adjustment disorder with depressed mood  Plan: Treatment Plan Strengths/Abilities:  Intelligent, Intuitive, Willing to participate in therapy Treatment Preferences:  Outpatient Individual Therapy Statement of Needs:  Patient is to use CBT, mindfulness and coping skills to help manage and/or decrease symptoms associated with their diagnosis. Symptoms:  Depressed/Irritable mood, worry, social withdrawal Problems Addressed:  Depressive thoughts, Sadness, Sleep issues, etc. Long Term Goals:  Pt to reduce overall level,  frequency, and intensity of the feelings of depression as evidenced by decreased irritability, negative self talk, and helpless feelings from 6 to 7 days/week to 0 to 1 days/week, per client report, for at least 3 consecutive months.  Progress: 20% Short Term Goals:  Pt to verbally express understanding of the relationship between feelings of depression and their impact on thinking patterns and behaviors.  Pt to verbalize an understanding of the role that distorted thinking plays in creating fears,  excessive worry, and ruminations.  Progress: 20% Target Date:  03/20/2023 Frequency:  Weekly Modality:  Cognitive Behavioral Therapy Interventions by Therapist:  Therapist will use CBT, Mindfulness exercises, Coping skills and Referrals, as needed by client.  Karie Kirks, [facility]

## 2023-01-21 ENCOUNTER — Ambulatory Visit (INDEPENDENT_AMBULATORY_CARE_PROVIDER_SITE_OTHER): Payer: Self-pay | Admitting: Psychology

## 2023-01-21 DIAGNOSIS — F4321 Adjustment disorder with depressed mood: Secondary | ICD-10-CM

## 2023-01-21 NOTE — Progress Notes (Signed)
[facility] Counselor/Therapist Progress Note  Patient ID: JIAYI ELICK, MRN: [id number],    Date: 01/21/2023  Time Spent: 60 mins  start time: 1000    end time: 1100  Treatment Type: Individual Therapy  Reported Symptoms: Pt presents for today's session, via phone due to no internet at her home.  Pt grants consent for session, stating that she is in her home with no one else present.  Pt also understands the limits of telephone sessions.  I shared with pt that I am in my office with no one else here with me either.     Mental Status Exam: Appearance:  Casual     Behavior: Appropriate  Motor: Normal  Speech/Language:  Clear and Coherent  Affect: Appropriate  Mood: normal  Thought process: normal  Thought content:   WNL  Sensory/Perceptual disturbances:   WNL  Orientation: oriented to person, place, and time/date  Attention: Good  Concentration: Good  Memory: WNL  Fund of knowledge:  Good  Insight:   Good  Judgment:  Good  Impulse Control: Good   Risk Assessment: Danger to Self:  No Self-injurious Behavior: No Danger to Others: No Duty to Warn:no Physical Aggression / Violence:No  Access to Firearms a concern: No  Gang Involvement:No   Subjective: Pt shares that "I have been doing well since our last session.  I have continued to walk more at the mall and in my neighborhood.  I have been struggling a little bit this past week and a half and I have not walked during that period of time."  Pt shares that her cousin is in town for [employer] and pt asked her for assistance in de-cluttering pt's home; pt's cousin has gone through a similar situation with her parents and their clutter.  Pt shares that she is embarrassed by the appearance of her home right now, due to all of the clutter.  Her cousin said she would help her while she is here in town.  Pt was able to make a small amount of progress while talking with her cousin on the phone; she is hopeful to make  even more progress when her cousin is here.  She has been working on small steps in her home.  Pt is also working on plans to remodel her kitchen by replacing some of her cabinets.  She does not want to have any workers to come into her home because of all the clutter in her home.  She hopes to decrease the clutter so she would feel better about having people in her home when it is cleaner.  Pt shares that [name] is still without a job after being laid off from his IT job at [employer].  Pt shares that she is no longer on social media and thinks that is good for her.  However, she feels more isolated by that decision and by not having anyone in her home due tot he clutter.  She has also not seen her mom or sisters since our last session, but she has talked with her mom on the phone.  [name] will be coming to [place] next week to help pt as well.  Pt shares she is continuing to listen to online sermons, following a female minister on TicTok, reading her Bible, and praying for herself and others.  Encouraged pt to continue with her self care activities and we will meet in 5 wks a follow up session.  Interventions: Cognitive Behavioral Therapy  Diagnosis:Adjustment disorder with  depressed mood  Plan: Treatment Plan Strengths/Abilities:  Intelligent, Intuitive, Willing to participate in therapy Treatment Preferences:  Outpatient Individual Therapy Statement of Needs:  Patient is to use CBT, mindfulness and coping skills to help manage and/or decrease symptoms associated with their diagnosis. Symptoms:  Depressed/Irritable mood, worry, social withdrawal Problems Addressed:  Depressive thoughts, Sadness, Sleep issues, etc. Long Term Goals:  Pt to reduce overall level, frequency, and intensity of the feelings of depression as evidenced by decreased irritability, negative self talk, and helpless feelings from 6 to 7 days/week to 0 to 1 days/week, per client report, for at least 3 consecutive months.  Progress: 20% Short  Term Goals:  Pt to verbally express understanding of the relationship between feelings of depression and their impact on thinking patterns and behaviors.  Pt to verbalize an understanding of the role that distorted thinking plays in creating fears, excessive worry, and ruminations.  Progress: 20% Target Date:  03/20/2023 Frequency:  Weekly Modality:  Cognitive Behavioral Therapy Interventions by Therapist:  Therapist will use CBT, Mindfulness exercises, Coping skills and Referrals, as needed by client.  Karie Kirks, [facility]

## 2023-02-01 ENCOUNTER — Encounter: Payer: No Typology Code available for payment source | Admitting: Nurse Practitioner

## 2023-02-01 NOTE — Progress Notes (Deleted)
Madelaine Bhat, CMA,acting as a Neurosurgeon for Arnette Felts, FNP.,have documented all relevant documentation on the behalf of Arnette Felts, FNP,as directed by  Arnette Felts, FNP while in the presence of Arnette Felts, FNP.  Subjective:    Patient ID: Wendy Pineda , female    DOB: [birth year] , 57 y.o.   MRN: [id number]  No chief complaint on file.   HPI  Patient presents today for HM, Patient reports compliance with medication. Patient denies any chest pain, SOB, or headaches. Patient has no concerns today.     Past Medical History:  Diagnosis Date  . Anemia   . Anxiety    on meds  . Arthritis    bilateral knees  . Carotid bruit    carotid dopplers 12/22/2010, no hemodynamically significant stenosis  . Depression    on meds  . GERD (gastroesophageal reflux disease)    occasional-tums prn-overindulgence  . Heart murmur    echo 10/11/2007 EF >55%mild, MR  . Hx of chest pain    neg. lexiscan myoview 02/23/2011  . Hyperlipidemia    diet controlled  . Sickle cell trait (HCC)      Family History  Problem Relation Age of Onset  . Colon polyps Mother 45  . Breast cancer Mother   . Stroke Maternal Grandmother   . Colon polyps Maternal Grandfather 72  . Colon cancer Maternal Grandfather 72  . Esophageal cancer Neg Hx   . Rectal cancer Neg Hx   . Stomach cancer Neg Hx      Current Outpatient Medications:  .  Ascorbic Acid (VITAMIN C PO), Take 1 tablet by mouth daily at 12 noon., Disp: , Rfl:  .  atorvastatin (LIPITOR) 20 MG tablet, Take 1 tablet (20 mg total) by mouth daily., Disp: 30 tablet, Rfl: 2 .  B Complex Vitamins (B COMPLEX PO), Take 1 packet by mouth daily. Reported on 06/27/2015, Disp: , Rfl:  .  COLLAGEN PO, Take 1 Scoop by mouth every other day., Disp: , Rfl:  .  escitalopram (LEXAPRO) 10 MG tablet, TAKE 1 TABLET(10 MG) BY MOUTH DAILY, Disp: 30 tablet, Rfl: 1 .  Magnesium 250 MG TABS, Take 1 tablet (250 mg total) by mouth every evening., Disp: 90 tablet, Rfl: 0 .   Multiple Vitamin (MULTIVITAMIN PO), Take 1 tablet by mouth daily at 2 am., Disp: , Rfl:  .  Multiple Vitamins-Minerals (ZINC PO), Take 1 tablet by mouth daily at 12 noon. (Patient not taking: Reported on 03/17/2021), Disp: , Rfl:  .  VITAMIN D PO, Take 1 tablet by mouth daily., Disp: , Rfl:  .  Vitamin D, Ergocalciferol, (DRISDOL) 1.25 MG (50000 UNIT) CAPS capsule, Take 1 capsule (50,000 Units total) by mouth once a week., Disp: 12 capsule, Rfl: 1 .  vortioxetine HBr (TRINTELLIX) 5 MG TABS tablet, Take 5 mg by mouth daily. (Patient not taking: Reported on 01/26/2022), Disp: , Rfl:    Allergies  Allergen Reactions  . Sulfa Antibiotics Hives, Swelling and Rash  . Elemental Sulfur Hives, Swelling and Rash      The patient states she uses {contraceptive methods:5051} for birth control. Patient's last menstrual period was 03/14/2017.Marland Kitchen {Dysmenorrhea-menorrhagia:21918}. Negative for: breast discharge, breast lump(s), breast pain and breast self exam. Associated symptoms include abnormal vaginal bleeding. Pertinent negatives include abnormal bleeding (hematology), anxiety, decreased libido, depression, difficulty falling sleep, dyspareunia, history of infertility, nocturia, sexual dysfunction, sleep disturbances, urinary incontinence, urinary urgency, vaginal discharge and vaginal itching. Diet regular.The patient states her exercise level  is    . The patient's tobacco use is:  Social History   Tobacco Use  Smoking Status Never  Smokeless Tobacco Never  . She has been exposed to passive smoke. The patient's alcohol use is:  Social History   Substance and Sexual Activity  Alcohol Use No  . Additional information: Last pap ***, next one scheduled for ***.    Review of Systems  Constitutional: Negative.   HENT: Negative.    Eyes: Negative.   Respiratory: Negative.    Cardiovascular: Negative.   Gastrointestinal: Negative.   Endocrine: Negative.   Genitourinary: Negative.   Musculoskeletal:  Negative.   Skin: Negative.   Allergic/Immunologic: Negative.   Neurological: Negative.   Hematological: Negative.   Psychiatric/Behavioral: Negative.      There were no vitals filed for this visit. There is no height or weight on file to calculate BMI.  Wt Readings from Last 3 Encounters:  07/15/22 180 lb (81.6 kg)  01/26/22 172 lb (78 kg)  08/13/21 179 lb (81.2 kg)     Objective:  Physical Exam      Assessment And Plan:     Encounter for annual health examination  Anxiety and depression  Hypercholesteremia  Vitamin D deficiency     No follow-ups on file. Patient was given opportunity to ask questions. Patient verbalized understanding of the plan and was able to repeat key elements of the plan. All questions were answered to their satisfaction.   Arnette Felts, FNP  I, Arnette Felts, FNP, have reviewed all documentation for this visit. The documentation on 02/01/23 for the exam, diagnosis, procedures, and orders are all accurate and complete.

## 2023-02-18 ENCOUNTER — Ambulatory Visit (INDEPENDENT_AMBULATORY_CARE_PROVIDER_SITE_OTHER): Payer: Self-pay | Admitting: Psychology

## 2023-02-18 DIAGNOSIS — F4321 Adjustment disorder with depressed mood: Secondary | ICD-10-CM

## 2023-02-18 NOTE — Progress Notes (Signed)
[facility] Counselor/Therapist Progress Note  Patient ID: Wendy Pineda, MRN: [id number],    Date: 02/18/2023  Time Spent: 45 mins  start time: 1000    end time: 1045  Treatment Type: Individual Therapy  Reported Symptoms: Pt presents for today's session, via phone due to no internet at her home.  Pt grants consent for session, stating that she is in her home with no one else present.  Pt also understands the limits of telephone sessions.  I shared with pt that I am in my office with no one else here with me either.     Mental Status Exam: Appearance:  Casual     Behavior: Appropriate  Motor: Normal  Speech/Language:  Clear and Coherent  Affect: Appropriate  Mood: normal  Thought process: normal  Thought content:   WNL  Sensory/Perceptual disturbances:   WNL  Orientation: oriented to person, place, and time/date  Attention: Good  Concentration: Good  Memory: WNL  Fund of knowledge:  Good  Insight:   Good  Judgment:  Good  Impulse Control: Good   Risk Assessment: Danger to Self:  No Self-injurious Behavior: No Danger to Others: No Duty to Warn:no Physical Aggression / Violence:No  Access to Firearms a concern: No  Gang Involvement:No   Subjective: Pt shares that "I have been doing pretty well since our last session, except I did have a rough patch 2-3 weeks ago.  I had not been walking as consistently as I was previously.  I was feeling agitated and I went for a walk and the feeling was heavy for me.  I went for a second walk because I needed my endorphins to kick in; I walked for several days and the heaviness finally lifted.  I went to visit my mom (66 yo) in [place] and went to a church service in [place] while I was there and that was good for me."  Pt has still be working on de-cluttering her home; talked with pt about ways to work on being more intentional and productive in her efforts.  Pt shares that [name] is still without a job after being laid off from  his IT job at [employer].  He has found a therapist and feels like it is being helpful for him.  Pt shares his girlfriend, Julious Oka, had to have her gallbladder removed and she is recovering well.  Pt has noticed aspects of [name]'s behaviors that she believes came from her; talked with her about how she can choose differently and she can encourage him to choose differently as well.  Pt shares that her cousin came to visit her last night and asked her to pray for him and she enjoyed their time together.  Pt shares that she has been slowly integrating herself back into social media, primarily on Tik[employer], following mostly spiritual people.  Pt shares she is continuing to listen to online sermons, following a female minister on TicTok, reading her Bible, and praying for herself and others.  Encouraged pt to continue with her self care activities and we will meet in 5 wks a follow up session.  Interventions: Cognitive Behavioral Therapy  Diagnosis:Adjustment disorder with depressed mood  Plan: Treatment Plan Strengths/Abilities:  Intelligent, Intuitive, Willing to participate in therapy Treatment Preferences:  Outpatient Individual Therapy Statement of Needs:  Patient is to use CBT, mindfulness and coping skills to help manage and/or decrease symptoms associated with their diagnosis. Symptoms:  Depressed/Irritable mood, worry, social withdrawal Problems Addressed:  Depressive thoughts, Sadness, Sleep  issues, etc. Long Term Goals:  Pt to reduce overall level, frequency, and intensity of the feelings of depression as evidenced by decreased irritability, negative self talk, and helpless feelings from 6 to 7 days/week to 0 to 1 days/week, per client report, for at least 3 consecutive months.  Progress: 20% Short Term Goals:  Pt to verbally express understanding of the relationship between feelings of depression and their impact on thinking patterns and behaviors.  Pt to verbalize an understanding of the role that  distorted thinking plays in creating fears, excessive worry, and ruminations.  Progress: 20% Target Date:  03/20/2023 Frequency:  Weekly Modality:  Cognitive Behavioral Therapy Interventions by Therapist:  Therapist will use CBT, Mindfulness exercises, Coping skills and Referrals, as needed by client.  Karie Kirks, [facility]

## 2023-03-08 ENCOUNTER — Encounter: Payer: No Typology Code available for payment source | Admitting: Nurse Practitioner

## 2023-03-16 ENCOUNTER — Ambulatory Visit (INDEPENDENT_AMBULATORY_CARE_PROVIDER_SITE_OTHER): Payer: Self-pay | Admitting: Family Medicine

## 2023-03-16 ENCOUNTER — Other Ambulatory Visit: Payer: Self-pay

## 2023-03-16 ENCOUNTER — Encounter: Payer: Self-pay | Admitting: Family Medicine

## 2023-03-16 VITALS — BP 116/70 | HR 68 | Temp 98.6°F | Ht 67.4 in | Wt 189.8 lb

## 2023-03-16 DIAGNOSIS — F331 Major depressive disorder, recurrent, moderate: Secondary | ICD-10-CM

## 2023-03-16 DIAGNOSIS — M546 Pain in thoracic spine: Secondary | ICD-10-CM

## 2023-03-16 DIAGNOSIS — Z0000 Encounter for general adult medical examination without abnormal findings: Secondary | ICD-10-CM

## 2023-03-16 DIAGNOSIS — E7800 Pure hypercholesterolemia, unspecified: Secondary | ICD-10-CM

## 2023-03-16 DIAGNOSIS — F419 Anxiety disorder, unspecified: Secondary | ICD-10-CM

## 2023-03-16 DIAGNOSIS — E559 Vitamin D deficiency, unspecified: Secondary | ICD-10-CM

## 2023-03-16 DIAGNOSIS — R059 Cough, unspecified: Secondary | ICD-10-CM

## 2023-03-16 DIAGNOSIS — F32A Depression, unspecified: Secondary | ICD-10-CM

## 2023-03-16 MED ORDER — VORTIOXETINE HBR 5 MG PO TABS
5.0000 mg | ORAL_TABLET | Freq: Every day | ORAL | 5 refills | Status: AC
  Filled 2023-03-16: qty 30, 30d supply, fill #0

## 2023-03-16 MED ORDER — ESCITALOPRAM OXALATE 10 MG PO TABS
10.0000 mg | ORAL_TABLET | Freq: Every day | ORAL | 5 refills | Status: AC
  Filled 2023-03-16: qty 30, 30d supply, fill #0

## 2023-03-16 MED ORDER — BENZONATATE 100 MG PO CAPS
100.0000 mg | ORAL_CAPSULE | Freq: Three times a day (TID) | ORAL | 0 refills | Status: AC | PRN
  Filled 2023-03-16: qty 30, 10d supply, fill #0

## 2023-03-16 NOTE — Progress Notes (Signed)
I,Jameka J Llittleton, CMA,acting as a Neurosurgeon for [employer], NP.,have documented all relevant documentation on the behalf of Ellender Hose, NP,as directed by  Ellender Hose, NP while in the presence of Ellender Hose, NP.  Subjective:    Patient ID: Wendy Pineda , female    DOB: 1965-07-19 , 57 y.o.   MRN: [id number]  Chief Complaint  Patient presents with   Annual Exam    HPI  Patient is a 57 year female who presents today for annual physical examination. Patient states compliance with medications.  Patient states she had a sharp pain on her right groin around July 2024 but it has resolved now. Patient has a diagnosis of depression but has been off medication for some time.She states she got off after losing her insurance. Will send medications to pharmacy and give samples of Trintellix 5 mg every day.       Past Medical History:  Diagnosis Date   Anemia    Anxiety    on meds   Arthritis    bilateral knees   Carotid bruit    carotid dopplers 12/22/2010, no hemodynamically significant stenosis   Depression    on meds   GERD (gastroesophageal reflux disease)    occasional-tums prn-overindulgence   Heart murmur    echo 10/11/2007 EF >55%mild, MR   Hx of chest pain    neg. lexiscan myoview 02/23/2011   Hyperlipidemia    diet controlled   Sickle cell trait (HCC)      Family History  Problem Relation Age of Onset   Colon polyps Mother 61   Breast cancer Mother    Stroke Maternal Grandmother    Colon polyps Maternal Grandfather 72   Colon cancer Maternal Grandfather 72   Esophageal cancer Neg Hx    Rectal cancer Neg Hx    Stomach cancer Neg Hx      Current Outpatient Medications:    Ascorbic Acid (VITAMIN C PO), Take 1 tablet by mouth daily at 12 noon., Disp: , Rfl:    B Complex Vitamins (B COMPLEX PO), Take 1 packet by mouth daily. Reported on 06/27/2015, Disp: , Rfl:    benzonatate (TESSALON PERLES) 100 MG capsule, Take 1 capsule (100 mg total) by mouth 3 (three) times daily  as needed., Disp: 30 capsule, Rfl: 0   COLLAGEN PO, Take 1 Scoop by mouth every other day., Disp: , Rfl:    Magnesium 250 MG TABS, Take 1 tablet (250 mg total) by mouth every evening., Disp: 90 tablet, Rfl: 0   Multiple Vitamin (MULTIVITAMIN PO), Take 1 tablet by mouth daily at 2 am., Disp: , Rfl:    Multiple Vitamins-Minerals (ZINC PO), Take 1 tablet by mouth daily at 12 noon., Disp: , Rfl:    escitalopram (LEXAPRO) 10 MG tablet, Take 1 tablet (10 mg total) by mouth daily., Disp: 30 tablet, Rfl: 5   Vitamin D, Ergocalciferol, (DRISDOL) 1.25 MG (50000 UNIT) CAPS capsule, Take 1 capsule (50,000 Units total) by mouth once a week. (Patient not taking: Reported on 03/16/2023), Disp: 12 capsule, Rfl: 1   vortioxetine HBr (TRINTELLIX) 5 MG TABS tablet, Take 1 tablet (5 mg total) by mouth daily., Disp: 30 tablet, Rfl: 5   Allergies  Allergen Reactions   Sulfa Antibiotics Hives, Swelling and Rash   Elemental Sulfur Hives, Swelling and Rash       Social History   Tobacco Use  Smoking Status Never  Smokeless Tobacco Never   Social History   Substance and  Sexual Activity  Alcohol Use No      Review of Systems  Constitutional: Negative.   HENT: Negative.    Eyes: Negative.   Respiratory: Negative.    Cardiovascular: Negative.   Gastrointestinal: Negative.   Endocrine: Negative.   Musculoskeletal:  Positive for back pain.  Skin: Negative.   Neurological: Negative.   Psychiatric/Behavioral: Negative.       Today's Vitals   03/16/23 1524  BP: 116/70  Pulse: 68  Temp: 98.6 F (37 C)  Weight: 189 lb 12.8 oz (86.1 kg)  Height: 5' 7.4" (1.712 m)  PainSc: 4   PainLoc: Back   Body mass index is 29.38 kg/m.  Wt Readings from Last 3 Encounters:  03/16/23 189 lb 12.8 oz (86.1 kg)  07/15/22 180 lb (81.6 kg)  01/26/22 172 lb (78 kg)     Objective:  Physical Exam Cardiovascular:     Rate and Rhythm: Normal rate and regular rhythm.  Pulmonary:     Effort: Pulmonary effort is  normal.     Breath sounds: Normal breath sounds.  Abdominal:     General: Bowel sounds are normal.  Skin:    General: Skin is warm and dry.  Neurological:     Mental Status: She is alert and oriented to person, place, and time. Mental status is at baseline.  Psychiatric:        Mood and Affect: Affect is tearful.        Cognition and Memory: Cognition normal.     Comments: Thinks her ex is sending people to watch her.         Assessment And Plan:     Encounter for general adult medical examination w/o abnormal findings  Hypercholesteremia -     Lipid panel  Vitamin D deficiency -     VITAMIN D 25 Hydroxy (Vit-D Deficiency, Fractures)  Anxiety and depression -     Escitalopram Oxalate; Take 1 tablet (10 mg total) by mouth daily.  Dispense: 30 tablet; Refill: 5  Moderate episode of recurrent major depressive disorder (HCC) -     CBC with Differential/Platelet -     CMP14+EGFR -     Vortioxetine HBr; Take 1 tablet (5 mg total) by mouth daily.  Dispense: 30 tablet; Refill: 5  Cough in adult -     Benzonatate; Take 1 capsule (100 mg total) by mouth 3 (three) times daily as needed.  Dispense: 30 capsule; Refill: 0     Return for 1 year physical. Patient was given opportunity to ask questions. Patient verbalized understanding of the plan and was able to repeat key elements of the plan. All questions were answered to their satisfaction.   Ellender Hose, NP  I, Ellender Hose, NP, have reviewed all documentation for this visit. The documentation on 03/21/2023 for the exam, diagnosis, procedures, and orders are all accurate and complete.

## 2023-03-17 LAB — LIPID PANEL
Chol/HDL Ratio: 3.4 {ratio} (ref 0.0–4.4)
Cholesterol, Total: 293 mg/dL — ABNORMAL HIGH (ref 100–199)
HDL: 85 mg/dL (ref >39)
LDL Chol Calc (NIH): 188 mg/dL — ABNORMAL HIGH (ref 0–99)
Triglycerides: 119 mg/dL (ref 0–149)
VLDL Cholesterol Cal: 20 mg/dL (ref 5–40)

## 2023-03-17 LAB — CBC WITH DIFFERENTIAL/PLATELET
Basophils Absolute: 0 10*3/uL (ref 0.0–0.2)
Basos: 1 %
EOS (ABSOLUTE): 0.1 10*3/uL (ref 0.0–0.4)
Eos: 2 %
Hematocrit: 38.5 % (ref 34.0–46.6)
Hemoglobin: 12.5 g/dL (ref 11.1–15.9)
Immature Grans (Abs): 0 10*3/uL (ref 0.0–0.1)
Immature Granulocytes: 0 %
Lymphocytes Absolute: 1.9 10*3/uL (ref 0.7–3.1)
Lymphs: 34 %
MCH: 26.7 pg (ref 26.6–33.0)
MCHC: 32.5 g/dL (ref 31.5–35.7)
MCV: 82 fL (ref 79–97)
Monocytes Absolute: 0.7 10*3/uL (ref 0.1–0.9)
Monocytes: 13 %
Neutrophils Absolute: 2.8 10*3/uL (ref 1.4–7.0)
Neutrophils: 50 %
Platelets: 303 10*3/uL (ref 150–450)
RBC: 4.68 x10E6/uL (ref 3.77–5.28)
RDW: 13.2 % (ref 11.7–15.4)
WBC: 5.5 10*3/uL (ref 3.4–10.8)

## 2023-03-17 LAB — CMP14+EGFR
ALT: 21 [IU]/L (ref 0–32)
AST: 18 [IU]/L (ref 0–40)
Albumin: 4.4 g/dL (ref 3.8–4.9)
Alkaline Phosphatase: 150 [IU]/L — ABNORMAL HIGH (ref 44–121)
BUN/Creatinine Ratio: 18 (ref 9–23)
BUN: 13 mg/dL (ref 6–24)
Bilirubin Total: 0.3 mg/dL (ref 0.0–1.2)
CO2: 25 mmol/L (ref 20–29)
Calcium: 9.2 mg/dL (ref 8.7–10.2)
Chloride: 102 mmol/L (ref 96–106)
Creatinine, Ser: 0.73 mg/dL (ref 0.57–1.00)
Globulin, Total: 2.7 g/dL (ref 1.5–4.5)
Glucose: 94 mg/dL (ref 70–99)
Potassium: 4.5 mmol/L (ref 3.5–5.2)
Sodium: 139 mmol/L (ref 134–144)
Total Protein: 7.1 g/dL (ref 6.0–8.5)
eGFR: 97 mL/min/{1.73_m2} (ref >59)

## 2023-03-17 LAB — VITAMIN D 25 HYDROXY (VIT D DEFICIENCY, FRACTURES): Vit D, 25-Hydroxy: 16.1 ng/mL — ABNORMAL LOW (ref 30.0–100.0)

## 2023-03-18 ENCOUNTER — Other Ambulatory Visit: Payer: Self-pay

## 2023-03-21 ENCOUNTER — Other Ambulatory Visit: Payer: Self-pay

## 2023-03-21 ENCOUNTER — Other Ambulatory Visit: Payer: Self-pay | Admitting: Family Medicine

## 2023-03-21 DIAGNOSIS — R059 Cough, unspecified: Secondary | ICD-10-CM | POA: Insufficient documentation

## 2023-03-21 DIAGNOSIS — E559 Vitamin D deficiency, unspecified: Secondary | ICD-10-CM | POA: Insufficient documentation

## 2023-03-21 DIAGNOSIS — F331 Major depressive disorder, recurrent, moderate: Secondary | ICD-10-CM | POA: Insufficient documentation

## 2023-03-21 MED ORDER — ATORVASTATIN CALCIUM 20 MG PO TABS
20.0000 mg | ORAL_TABLET | Freq: Every day | ORAL | 11 refills | Status: AC
  Filled 2023-03-21 – 2023-05-06 (×3): qty 30, 30d supply, fill #0

## 2023-03-21 MED ORDER — VITAMIN D (ERGOCALCIFEROL) 1.25 MG (50000 UNIT) PO CAPS
50000.0000 [IU] | ORAL_CAPSULE | ORAL | 2 refills | Status: AC
  Filled 2023-03-21 – 2023-05-06 (×3): qty 5, 35d supply, fill #0

## 2023-03-24 ENCOUNTER — Ambulatory Visit: Payer: Self-pay | Admitting: Psychology

## 2023-03-24 DIAGNOSIS — F4321 Adjustment disorder with depressed mood: Secondary | ICD-10-CM

## 2023-03-24 NOTE — Progress Notes (Signed)
[facility] Counselor/Therapist Progress Note  Patient ID: Wendy Pineda, MRN: [id number],    Date: 03/24/2023  Time Spent: 60 mins  start time: 1000    end time: 1100  Treatment Type: Individual Therapy  Reported Symptoms: Pt presents for today's session, via phone due to no internet at her home.  Pt grants consent for session, stating that she is in her home with no one else present.  Pt also understands the limits of telephone sessions.  I shared with pt that I am in my office with no one else here with me either.     Mental Status Exam: Appearance:  Casual     Behavior: Appropriate  Motor: Normal  Speech/Language:  Clear and Coherent  Affect: Appropriate  Mood: normal  Thought process: normal  Thought content:   WNL  Sensory/Perceptual disturbances:   WNL  Orientation: oriented to person, place, and time/date  Attention: Good  Concentration: Good  Memory: WNL  Fund of knowledge:  Good  Insight:   Good  Judgment:  Good  Impulse Control: Good   Risk Assessment: Danger to Self:  No Self-injurious Behavior: No Danger to Others: No Duty to Warn:no Physical Aggression / Violence:No  Access to Firearms a concern: No  Gang Involvement:No   Subjective: Pt shares that "I have been doing pretty good; I have been trying to do some different experiences.  I had gotten depressed and I cried out to God and told him it is just not fair that my ex got to just go on with his life and he has someone else.  I had a three day fast with only water and I was praying and reading my Bible.  The third night of my fast, a childhood friend (Richard or [name]) texted me.  I did not respond because I was praying at the time.  I did respond the next day and we have been chatting back and forth on the phone almost everyday and we even went to [employer] last week; we had a nice time together.  I was planning to go to [employer] with [name] and Lilly last night but it started raining so I did not  go.  Pt shares that she and Ricky have the same birthday and they grew up like brother and sister.  [name] lost his wife in 2022 in a bad car accident.  [name] has been very respectful of pt and her time.  They have recently gone to church together and "that was our first date."  Pt shares that she has been sleeping a little better since our last session.  She has not been walking as much since it has been colder outside.  Pt has taken an Secretary/administrator class two weekends ago and she passed the licensure test.  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  Pt shares she is continuing to listen to online sermons, following a female minister on TicTok, reading her Bible, and praying for herself and others.  Encouraged pt to continue with her self care activities and we will meet in 5 wks a follow up session.  Interventions: Cognitive Behavioral Therapy  Diagnosis:Adjustment disorder with depressed mood  Plan: Treatment Plan Strengths/Abilities:  Intelligent, Intuitive, Willing to participate in therapy Treatment Preferences:  Outpatient Individual Therapy Statement of Needs:  Patient is to use CBT, mindfulness and coping skills to help manage and/or decrease symptoms associated with their diagnosis.  Symptoms:  Depressed/Irritable mood, worry, social withdrawal Problems Addressed:  Depressive thoughts, Sadness, Sleep issues, etc. Long Term Goals:  Pt to reduce overall level, frequency, and intensity of the feelings of depression as evidenced by decreased irritability, negative self talk, and helpless feelings from 6 to 7 days/week to 0 to 1 days/week, per client report, for at least 3 consecutive months.  Progress: 20% Short Term Goals:  Pt to verbally express understanding of the relationship between feelings of depression and their impact on thinking patterns and behaviors.  Pt to verbalize an understanding of the role that distorted thinking  plays in creating fears, excessive worry, and ruminations.  Progress: 20% Target Date:  03/19/2024 Frequency:  Weekly Modality:  Cognitive Behavioral Therapy Interventions by Therapist:  Therapist will use CBT, Mindfulness exercises, Coping skills and Referrals, as needed by client.  Karie Kirks, [facility]

## 2023-03-29 ENCOUNTER — Other Ambulatory Visit: Payer: Self-pay

## 2023-04-22 ENCOUNTER — Ambulatory Visit: Payer: Self-pay | Admitting: Psychology

## 2023-04-29 ENCOUNTER — Ambulatory Visit: Payer: Self-pay | Admitting: Psychology

## 2023-05-03 ENCOUNTER — Ambulatory Visit: Payer: Self-pay | Admitting: Psychology

## 2023-05-04 ENCOUNTER — Ambulatory Visit: Payer: Self-pay | Admitting: Psychology

## 2023-05-04 DIAGNOSIS — F4321 Adjustment disorder with depressed mood: Secondary | ICD-10-CM

## 2023-05-04 NOTE — Progress Notes (Signed)
[facility] Counselor/Therapist Progress Note  Patient ID: ANU STAGNER, MRN: [id number],    Date: 05/04/2023  Time Spent: 50 mins  start time: 1300    end time: 1350  Treatment Type: Individual Therapy  Reported Symptoms: Pt presents for today's session, via Caregility video.  Pt grants consent for session, stating that she is in her car with no one else present.  Pt also understands the limits of telephone sessions.  I shared with pt that I am in my office with no one else here with me either.     Mental Status Exam: Appearance:  Casual     Behavior: Appropriate  Motor: Normal  Speech/Language:  Clear and Coherent  Affect: Appropriate  Mood: normal  Thought process: normal  Thought content:   WNL  Sensory/Perceptual disturbances:   WNL  Orientation: oriented to person, place, and time/date  Attention: Good  Concentration: Good  Memory: WNL  Fund of knowledge:  Good  Insight:   Good  Judgment:  Good  Impulse Control: Good   Risk Assessment: Danger to Self:  No Self-injurious Behavior: No Danger to Others: No Duty to Warn:no Physical Aggression / Violence:No  Access to Firearms a concern: No  Gang Involvement:No   Subjective: Pt shares that "There have been several things that have happened since our last session.  My mom has been encouraging me to push myself to do different things because I was feeling stuck.  I enrolled in school recently; I am not sure if it was the right thing; I may have bitten off more than I can chew.  I am feeling a bit overwhelmed."  She is taking four classes at [employer] right now; she is taking chemistry, Hydrologist, ethics, and accounting.  Talked with pt about talking with a Engineer, site or an academic advisor to find out what her options are for withdrawing from these classes.  Also encouraged pt to consider taking one course in each Summer school session as a means of easing her way back into school.  Also encouraged pt  to look for a part time job to ease her way back into the job market.  Pt shares that she and her female friend, [name], had been going out to eat and went to church together and she enjoyed that.  Pt shares that they had their last date last weekend because they have decided to see other people.  Pt shares that she has been sleeping a little better since our last session.  Pt shares she is continuing to listen to online sermons, following a female minister on TicTok, reading her Bible, and praying for herself and others.  Encouraged pt to continue with her self care activities and we will meet in 5 wks a follow up session.  Interventions: Cognitive Behavioral Therapy  Diagnosis:Adjustment disorder with depressed mood  Plan: Treatment Plan Strengths/Abilities:  Intelligent, Intuitive, Willing to participate in therapy Treatment Preferences:  Outpatient Individual Therapy Statement of Needs:  Patient is to use CBT, mindfulness and coping skills to help manage and/or decrease symptoms associated with their diagnosis. Symptoms:  Depressed/Irritable mood, worry, social withdrawal Problems Addressed:  Depressive thoughts, Sadness, Sleep issues, etc. Long Term Goals:  Pt to reduce overall level, frequency, and intensity of the feelings of depression as evidenced by decreased irritability, negative self talk, and helpless feelings from 6 to 7 days/week to 0 to 1 days/week, per client report, for at least 3 consecutive months.  Progress: 20% Short Term Goals:  Pt to verbally express understanding of the relationship between feelings of depression and their impact on thinking patterns and behaviors.  Pt to verbalize an understanding of the role that distorted thinking plays in creating fears, excessive worry, and ruminations.  Progress: 20% Target Date:  03/19/2024 Frequency:  Weekly Modality:  Cognitive Behavioral Therapy Interventions by Therapist:  Therapist will use CBT, Mindfulness exercises, Coping skills  and Referrals, as needed by client.  Karie Kirks, [facility]

## 2023-05-06 ENCOUNTER — Other Ambulatory Visit: Payer: Self-pay

## 2023-05-25 ENCOUNTER — Ambulatory Visit (INDEPENDENT_AMBULATORY_CARE_PROVIDER_SITE_OTHER): Payer: Self-pay | Admitting: Psychology

## 2023-05-25 DIAGNOSIS — F4321 Adjustment disorder with depressed mood: Secondary | ICD-10-CM

## 2023-05-25 NOTE — Progress Notes (Signed)
[facility] Counselor/Therapist Progress Note  Patient ID: Wendy Pineda, MRN: [id number],    Date: 05/25/2023  Time Spent: 50 mins  start time: 1300    end time: 1350  Treatment Type: Individual Therapy  Reported Symptoms: Pt presents for today's session, via Caregility video.  Pt grants consent for session, stating that she is in her car with no one else present.  Pt also understands the limits of telephone sessions.  I shared with pt that I am in my office with no one else here with me either.     Mental Status Exam: Appearance:  Casual     Behavior: Appropriate  Motor: Normal  Speech/Language:  Clear and Coherent  Affect: Appropriate  Mood: normal  Thought process: normal  Thought content:   WNL  Sensory/Perceptual disturbances:   WNL  Orientation: oriented to person, place, and time/date  Attention: Good  Concentration: Good  Memory: WNL  Fund of knowledge:  Good  Insight:   Good  Judgment:  Good  Impulse Control: Good   Risk Assessment: Danger to Self:  No Self-injurious Behavior: No Danger to Others: No Duty to Warn:no Physical Aggression / Violence:No  Access to Firearms a concern: No  Gang Involvement:No   Subjective: Pt shares that "I have been concerned recently about my comprehension of information in school.  I am not sure if I am reading too much into it or not.  I have decided to drop my chemistry class and my math class.  I am keeping my Ethics class but am still struggling with my Accounting class; we had a comprehensive class last night and I got stuck and ended up making a 39 on the exam.  I am thinking about dropping that as well and just keep my Ethics class."  Pt is still considering what is best for her at this point; encouraged her to consider taking one class each session of summer school to see how those classes go and then evaluate her performance before moving forward.  Pt is also considering applying for a part time job as a means of  easing herself back into the job market.  She also realizes that not having internet at her home is very difficult for her to be successful for school; she is using the [employer], hotel lobbies, etc to do her homework.  She is seeing Wendy Pineda again; for Valentine's Day, they took their mothers out to dinner and they both enjoyed that time.  Pt shares that she and Wendy Pineda and Wendy Pineda and his girlfriend all have season passes to [employer]; "I need to do things that are fun for me and I want to enjoy my time."  Pt shares she is continuing to listen to online sermons, following a female minister on TicTok, reading her Bible, and praying for herself and others.  Encouraged pt to continue with her self care activities and we will meet in 5 wks a follow up session.  Interventions: Cognitive Behavioral Therapy  Diagnosis:Adjustment disorder with depressed mood  Plan: Treatment Plan Strengths/Abilities:  Intelligent, Intuitive, Willing to participate in therapy Treatment Preferences:  Outpatient Individual Therapy Statement of Needs:  Patient is to use CBT, mindfulness and coping skills to help manage and/or decrease symptoms associated with their diagnosis. Symptoms:  Depressed/Irritable mood, worry, social withdrawal Problems Addressed:  Depressive thoughts, Sadness, Sleep issues, etc. Long Term Goals:  Pt to reduce overall level, frequency, and intensity of the feelings of depression as evidenced by decreased irritability,  negative self talk, and helpless feelings from 6 to 7 days/week to 0 to 1 days/week, per client report, for at least 3 consecutive months.  Progress: 20% Short Term Goals:  Pt to verbally express understanding of the relationship between feelings of depression and their impact on thinking patterns and behaviors.  Pt to verbalize an understanding of the role that distorted thinking plays in creating fears, excessive worry, and ruminations.  Progress: 20% Target Date:   03/19/2024 Frequency:  Weekly Modality:  Cognitive Behavioral Therapy Interventions by Therapist:  Therapist will use CBT, Mindfulness exercises, Coping skills and Referrals, as needed by client.  Karie Kirks, [facility]

## 2023-05-30 IMAGING — MG MM DIGITAL SCREENING BILAT W/ TOMO AND CAD
8 series · 8 of 24 positions shown · non-contrast
Comparison: Previous exam(s).

CLINICAL DATA: Screening.

EXAM:
DIGITAL SCREENING BILATERAL MAMMOGRAM WITH TOMOSYNTHESIS AND CAD
TECHNIQUE: Bilateral screening digital craniocaudal and mediolateral oblique
mammograms were obtained. Bilateral screening digital breast
tomosynthesis was performed. The images were evaluated with
computer-aided detection.

[R CC synth-2D]
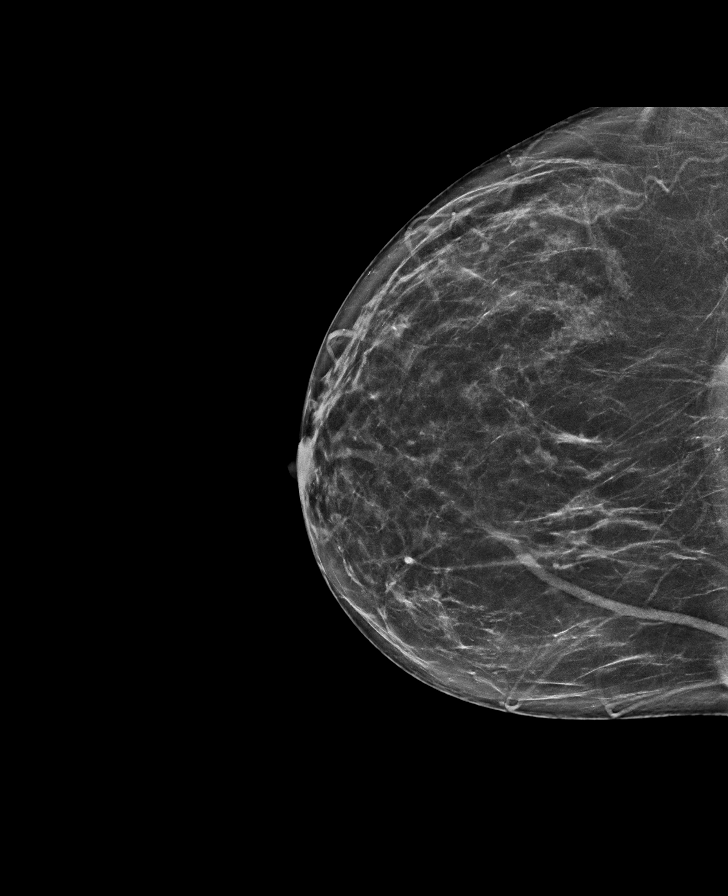

[R MLO synth-2D]
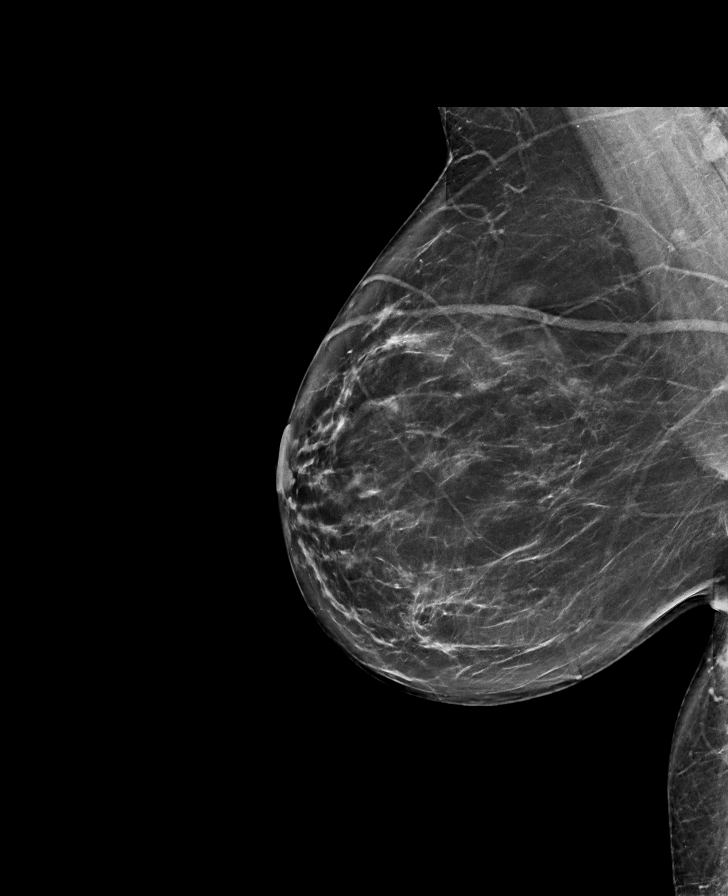

[L CC synth-2D]
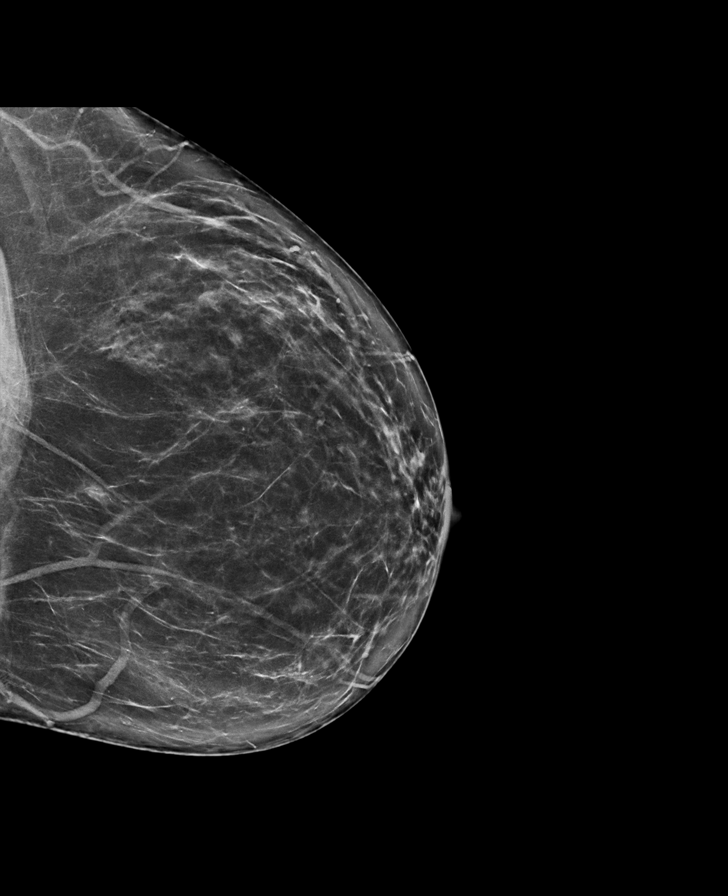

[L MLO synth-2D]
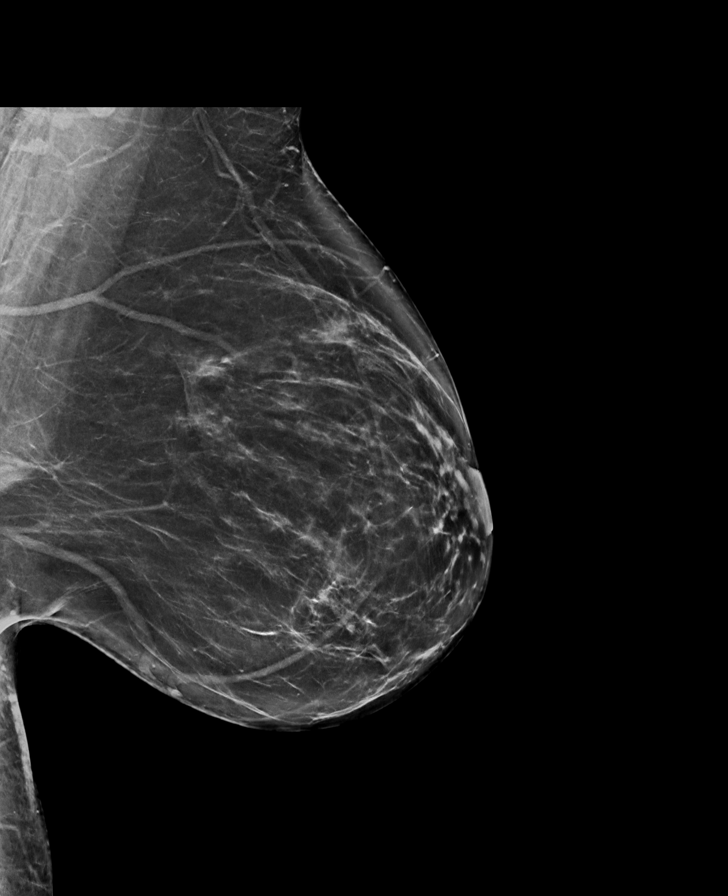

[L MLO tomo · tomo slice 47/94.0]
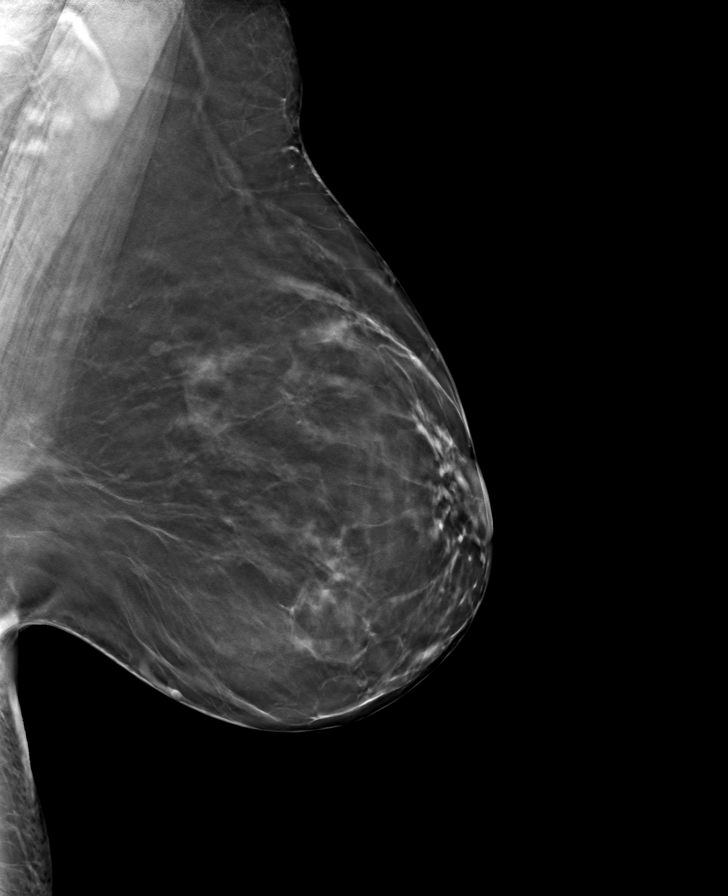

[L CC tomo · tomo slice 40/79.0]
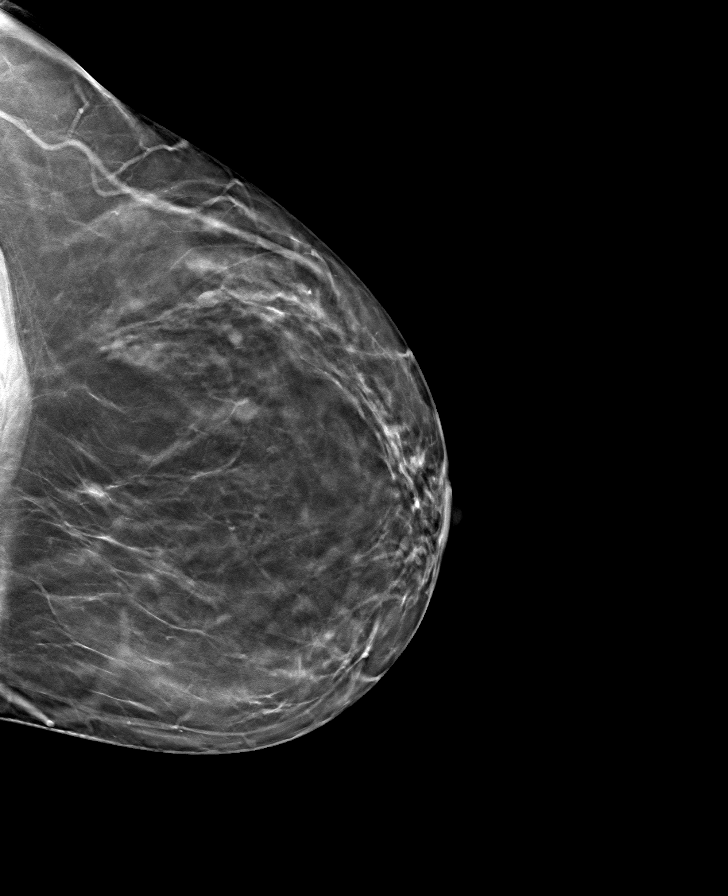

[R MLO tomo · tomo slice 45/90.0]
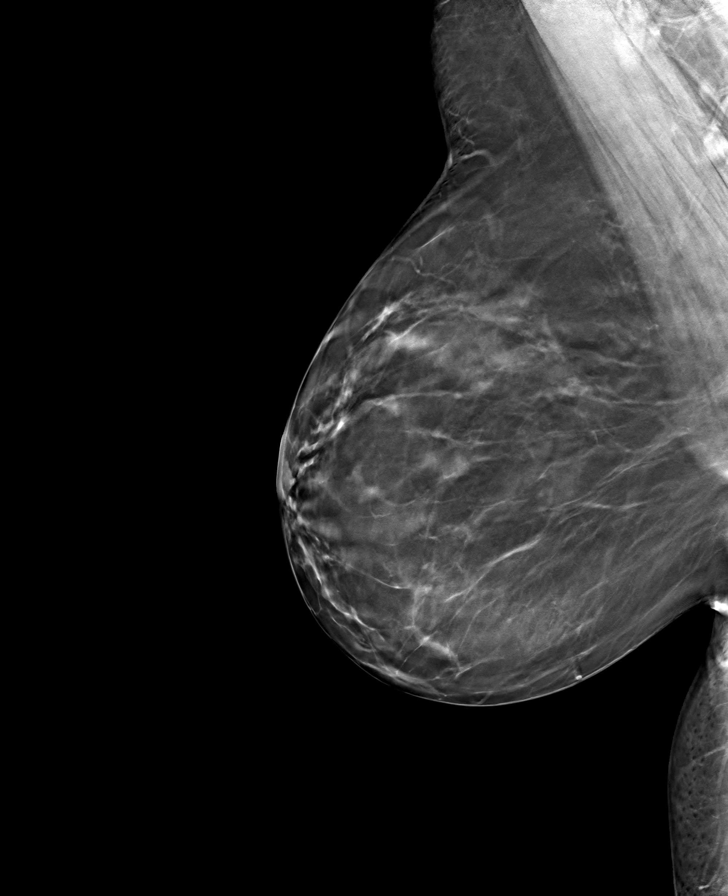

[R CC tomo · tomo slice 37/74.0]
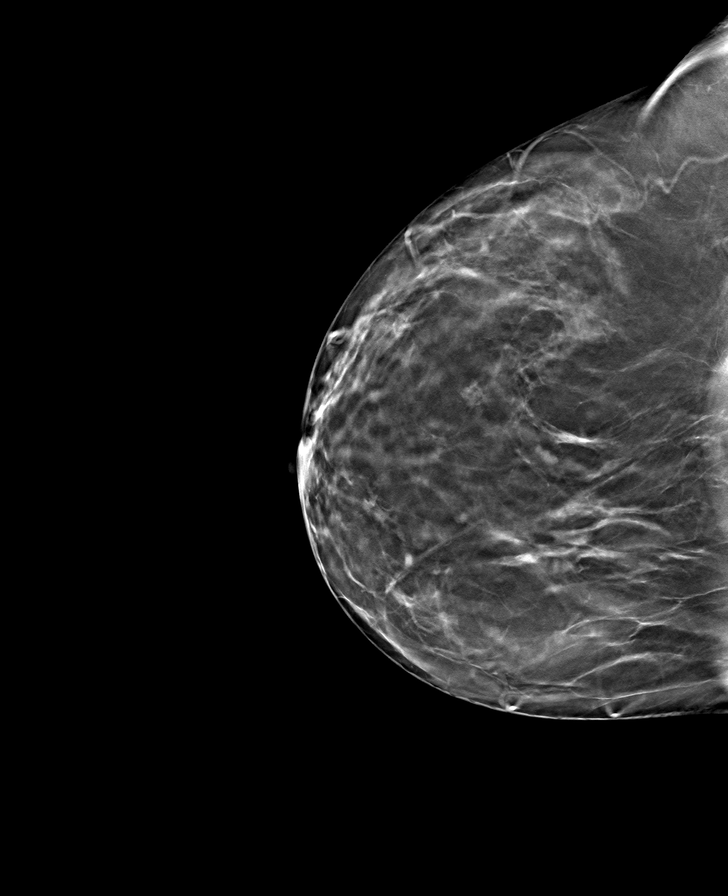

[8 of 24 positions shown; findings below may reference images not displayed]

ACR Breast Density Category b: There are scattered areas of
fibroglandular density.
FINDINGS: There are no findings suspicious for malignancy.
IMPRESSION: No mammographic evidence of malignancy. A result letter of this
screening mammogram will be mailed directly to the patient.

RECOMMENDATION:
Screening mammogram in one year. (Code:51-O-LD2)

BI-RADS CATEGORY  1: Negative.

## 2023-07-01 ENCOUNTER — Ambulatory Visit: Payer: Self-pay | Admitting: Psychology

## 2023-08-12 ENCOUNTER — Ambulatory Visit: Payer: Self-pay | Admitting: Psychology

## 2023-09-08 ENCOUNTER — Ambulatory Visit (INDEPENDENT_AMBULATORY_CARE_PROVIDER_SITE_OTHER): Payer: Self-pay | Admitting: Psychology

## 2023-09-08 DIAGNOSIS — F4321 Adjustment disorder with depressed mood: Secondary | ICD-10-CM

## 2023-09-08 NOTE — Progress Notes (Signed)
 [facility] Counselor/Therapist Progress Note  Patient ID: Wendy Pineda, MRN: [id number],    Date: 09/08/2023  Time Spent: 50 mins  start time: 1510    end time: 1600  Treatment Type: Individual Therapy  Reported Symptoms: Pt presents for today's session, via Caregility video.  Pt grants consent for session, stating that she is in her car with no one else present.  Pt also understands the limits of virtual sessions.  I shared with pt that I am in my office with no one else here with me either.     Mental Status Exam: Appearance:  Casual     Behavior: Appropriate  Motor: Normal  Speech/Language:  Clear and Coherent  Affect: Appropriate  Mood: normal  Thought process: normal  Thought content:   WNL  Sensory/Perceptual disturbances:   WNL  Orientation: oriented to person, place, and time/date  Attention: Good  Concentration: Good  Memory: WNL  Fund of knowledge:  Good  Insight:   Good  Judgment:  Good  Impulse Control: Good   Risk Assessment: Danger to Self:  No Self-injurious Behavior: No Danger to Others: No Duty to Warn:no Physical Aggression / Violence:No  Access to Firearms a concern: No  Gang Involvement:No   Subjective: Pt shares that "I wanted to talk with you today about the situation that occurred with my mom's birthday.  My sister and I have always had issues and I often feel like an outsider with her.  I was not able to be in [place] for the first day of the celebration and I think that hurt my mom's feelings.  I made pinwheels for my mom's birthday and she said she did not like them; she did not appreciate what I had done for her.  My sister suggested I give them away to the church and that was hurtful as well."  Pt shares that she did go back to [place] a couple of weeks later for her own birthday; she continues to feel like an outsider in her own family.  Pt also shares that she went back to [facility] [facility] for Mother's Day as well to celebrate her mom.  Pt  shares her ex-husband sent her a card again this year for Mother's Day and she has also talked with her ex about how things are going with their son, [name]; they both feel like [name]'s girlfriend is not good for him.  Pt shares she still had a lot of clutter in her home.  Pt shares that she is going to have to go back to court to get her portion of her husband's retirement funds that is due to her.  She did finish her Ethics class at [employer] and she was happy about that; she made an A in that class.  Pt shares that she and Willard Harman are no longer seeing each other; he was not willing to continue seeing her if they could not have sex and pt was not willing to do so.  She is applying for part time jobs.  Pt shares she is continuing to listen to online sermons, following a female minister on TicTok, reading her Bible, and praying for herself and others.  Encouraged pt to continue with her self care activities and she will call the office to schedule a follow up session.  Interventions: Cognitive Behavioral Therapy  Diagnosis:Adjustment disorder with depressed mood  Plan: Treatment Plan Strengths/Abilities:  Intelligent, Intuitive, Willing to participate in therapy Treatment Preferences:  Outpatient Individual Therapy Statement of Needs:  Patient is to use CBT, mindfulness and coping skills to help manage and/or decrease symptoms associated with their diagnosis. Symptoms:  Depressed/Irritable mood, worry, social withdrawal Problems Addressed:  Depressive thoughts, Sadness, Sleep issues, etc. Long Term Goals:  Pt to reduce overall level, frequency, and intensity of the feelings of depression as evidenced by decreased irritability, negative self talk, and helpless feelings from 6 to 7 days/week to 0 to 1 days/week, per client report, for at least 3 consecutive months.  Progress: 20% Short Term Goals:  Pt to verbally express understanding of the relationship between feelings of depression and their impact on thinking  patterns and behaviors.  Pt to verbalize an understanding of the role that distorted thinking plays in creating fears, excessive worry, and ruminations.  Progress: 20% Target Date:  03/19/2024 Frequency:  Weekly Modality:  Cognitive Behavioral Therapy Interventions by Therapist:  Therapist will use CBT, Mindfulness exercises, Coping skills and Referrals, as needed by client.  Jhonny Moss, [facility]

## 2024-03-20 ENCOUNTER — Encounter: Payer: Self-pay | Admitting: Nurse Practitioner

## 2024-03-20 NOTE — Progress Notes (Deleted)
 [name], CMA,acting as a neurosurgeon for [name], FNP.,have documented all relevant documentation on the behalf of [name], FNP,as directed by  [name], FNP while in the presence of [name], FNP.  Subjective:    Patient ID: Wendy Pineda , female    DOB: [birth year] , 58 y.o.   MRN: [id number]  No chief complaint on file.   HPI  HPI   Past Medical History:  Diagnosis Date   Anemia    Anxiety    on meds   Arthritis    bilateral knees   Carotid bruit    carotid dopplers 12/22/2010, no hemodynamically significant stenosis   Depression    on meds   GERD (gastroesophageal reflux disease)    occasional-tums prn-overindulgence   Heart murmur    echo 10/11/2007 EF >55%mild, MR   Hx of chest pain    neg. lexiscan myoview 02/23/2011   Hyperlipidemia    diet controlled   Sickle cell trait      Family History  Problem Relation Age of Onset   Colon polyps Mother 72   Breast cancer Mother    Stroke Maternal Grandmother    Colon polyps Maternal Grandfather 72   Colon cancer Maternal Grandfather 72   Esophageal cancer Neg Hx    Rectal cancer Neg Hx    Stomach cancer Neg Hx     Current Medications[1]   Allergies[2]    The patient states she uses {contraceptive methods:5051} for birth control. Patient's last menstrual period was 03/14/2017.SABRA {Dysmenorrhea-menorrhagia:21918}. Negative for: breast discharge, breast lump(s), breast pain and breast self exam. Associated symptoms include abnormal vaginal bleeding. Pertinent negatives include abnormal bleeding (hematology), anxiety, decreased libido, depression, difficulty falling sleep, dyspareunia, history of infertility, nocturia, sexual dysfunction, sleep disturbances, urinary incontinence, urinary urgency, vaginal discharge and vaginal itching. Diet regular.The patient states her exercise level is    . The patient's tobacco use is: Tobacco Use History[3]. She has been exposed to passive smoke. The patient's alcohol  use is:  Social History   Substance and Sexual Activity  Alcohol Use No  . Additional information: Last pap ***, next one scheduled for ***.    Review of Systems   There were no vitals filed for this visit. There is no height or weight on file to calculate BMI.  Wt Readings from Last 3 Encounters:  03/16/23 189 lb 12.8 oz (86.1 kg)  07/15/22 180 lb (81.6 kg)  01/26/22 172 lb (78 kg)     Objective:  Physical Exam      Assessment And Plan:     Encounter for annual health examination  Hypercholesteremia  Vitamin D  deficiency  Anxiety and depression     No follow-ups on file. Patient was given opportunity to ask questions. Patient verbalized understanding of the plan and was able to repeat key elements of the plan. All questions were answered to their satisfaction.   [name], FNP  I, [name], FNP, have reviewed all documentation for this visit. The documentation on 03/20/2024 for the exam, diagnosis, procedures, and orders are all accurate and complete.     [1]  Current Outpatient Medications:    Ascorbic Acid (VITAMIN C PO), Take 1 tablet by mouth daily at 12 noon., Disp: , Rfl:    atorvastatin  (LIPITOR) 20 MG tablet, Take 1 tablet (20 mg total) by mouth daily., Disp: 30 tablet, Rfl: 11   B Complex Vitamins (B COMPLEX PO), Take 1 packet by mouth daily. Reported on 06/27/2015, Disp: ,  Rfl:    COLLAGEN PO, Take 1 Scoop by mouth every other day., Disp: , Rfl:    escitalopram  (LEXAPRO ) 10 MG tablet, Take 1 tablet (10 mg total) by mouth daily., Disp: 30 tablet, Rfl: 5   Magnesium  250 MG TABS, Take 1 tablet (250 mg total) by mouth every evening., Disp: 90 tablet, Rfl: 0   Multiple Vitamin (MULTIVITAMIN PO), Take 1 tablet by mouth daily at 2 am., Disp: , Rfl:    Multiple Vitamins-Minerals (ZINC PO), Take 1 tablet by mouth daily at 12 noon., Disp: , Rfl:    Vitamin D , Ergocalciferol , (DRISDOL ) 1.25 MG (50000 UNIT) CAPS capsule, Take 1 capsule (50,000 Units total) by  mouth every 7 (seven) days., Disp: 5 capsule, Rfl: 2   vortioxetine  HBr (TRINTELLIX ) 5 MG TABS tablet, Take 1 tablet (5 mg total) by mouth daily., Disp: 30 tablet, Rfl: 5 [2]  Allergies Allergen Reactions   Sulfa Antibiotics Hives, Swelling and Rash   Elemental Sulfur Hives, Swelling and Rash  [3]  Social History Tobacco Use  Smoking Status Never  Smokeless Tobacco Never
